# Patient Record
Sex: Female | Born: 1980 | Race: Black or African American | Hispanic: No | Marital: Married | Smoking: Current every day smoker
Health system: Southern US, Community
[De-identification: ages and names within clinical notes are randomized; demographics above are authoritative.]

## PROBLEM LIST (undated history)

## (undated) DIAGNOSIS — G932 Benign intracranial hypertension: Secondary | ICD-10-CM

## (undated) DIAGNOSIS — I493 Ventricular premature depolarization: Secondary | ICD-10-CM

## (undated) DIAGNOSIS — R002 Palpitations: Secondary | ICD-10-CM

## (undated) DIAGNOSIS — M79606 Pain in leg, unspecified: Secondary | ICD-10-CM

## (undated) DIAGNOSIS — E7800 Pure hypercholesterolemia, unspecified: Secondary | ICD-10-CM

## (undated) DIAGNOSIS — C801 Malignant (primary) neoplasm, unspecified: Secondary | ICD-10-CM

## (undated) DIAGNOSIS — R197 Diarrhea, unspecified: Secondary | ICD-10-CM

## (undated) DIAGNOSIS — J45909 Unspecified asthma, uncomplicated: Secondary | ICD-10-CM

## (undated) DIAGNOSIS — F411 Generalized anxiety disorder: Secondary | ICD-10-CM

## (undated) DIAGNOSIS — I4949 Other premature depolarization: Secondary | ICD-10-CM

## (undated) HISTORY — DX: Pure hypercholesterolemia, unspecified: E78.00

## (undated) HISTORY — DX: Malignant (primary) neoplasm, unspecified: C80.1

## (undated) HISTORY — DX: Pain in leg, unspecified: M79.606

## (undated) HISTORY — DX: Diarrhea, unspecified: R19.7

## (undated) HISTORY — DX: Other premature depolarization: I49.49

## (undated) HISTORY — DX: Ventricular premature depolarization: I49.3

## (undated) HISTORY — DX: Palpitations: R00.2

## (undated) HISTORY — DX: Generalized anxiety disorder: F41.1

---

## 2006-03-31 ENCOUNTER — Emergency Department (HOSPITAL_COMMUNITY): Admission: EM | Admit: 2006-03-31 | Discharge: 2006-03-31 | Payer: Self-pay | Admitting: Emergency Medicine

## 2006-09-09 ENCOUNTER — Inpatient Hospital Stay (HOSPITAL_COMMUNITY): Admission: AD | Admit: 2006-09-09 | Discharge: 2006-09-09 | Payer: Self-pay | Admitting: Obstetrics and Gynecology

## 2006-10-19 ENCOUNTER — Inpatient Hospital Stay (HOSPITAL_COMMUNITY): Admission: AD | Admit: 2006-10-19 | Discharge: 2006-10-19 | Payer: Self-pay | Admitting: Obstetrics and Gynecology

## 2006-11-09 ENCOUNTER — Inpatient Hospital Stay (HOSPITAL_COMMUNITY): Admission: AD | Admit: 2006-11-09 | Discharge: 2006-11-09 | Payer: Self-pay | Admitting: Obstetrics and Gynecology

## 2006-11-26 ENCOUNTER — Inpatient Hospital Stay (HOSPITAL_COMMUNITY): Admission: AD | Admit: 2006-11-26 | Discharge: 2006-11-29 | Payer: Self-pay | Admitting: Obstetrics and Gynecology

## 2007-12-20 ENCOUNTER — Emergency Department (HOSPITAL_COMMUNITY): Admission: EM | Admit: 2007-12-20 | Discharge: 2007-12-21 | Payer: Self-pay | Admitting: Emergency Medicine

## 2009-03-11 ENCOUNTER — Inpatient Hospital Stay (HOSPITAL_COMMUNITY): Admission: AD | Admit: 2009-03-11 | Discharge: 2009-03-11 | Payer: Self-pay | Admitting: Obstetrics

## 2009-05-25 ENCOUNTER — Inpatient Hospital Stay (HOSPITAL_COMMUNITY): Admission: AD | Admit: 2009-05-25 | Discharge: 2009-05-25 | Payer: Self-pay | Admitting: Obstetrics and Gynecology

## 2009-05-29 ENCOUNTER — Encounter (INDEPENDENT_AMBULATORY_CARE_PROVIDER_SITE_OTHER): Payer: Self-pay | Admitting: Obstetrics and Gynecology

## 2009-05-29 ENCOUNTER — Inpatient Hospital Stay (HOSPITAL_COMMUNITY): Admission: RE | Admit: 2009-05-29 | Discharge: 2009-05-31 | Payer: Self-pay | Admitting: Obstetrics and Gynecology

## 2009-06-04 ENCOUNTER — Inpatient Hospital Stay (HOSPITAL_COMMUNITY): Admission: AD | Admit: 2009-06-04 | Discharge: 2009-06-05 | Payer: Self-pay | Admitting: Obstetrics

## 2010-08-03 ENCOUNTER — Other Ambulatory Visit: Payer: Self-pay | Admitting: Obstetrics and Gynecology

## 2010-09-22 LAB — CROSSMATCH: ABO/RH(D): A POS

## 2010-09-22 LAB — CBC
HCT: 26.2 % — ABNORMAL LOW (ref 36.0–46.0)
HCT: 26.5 % — ABNORMAL LOW (ref 36.0–46.0)
HCT: 31.3 % — ABNORMAL LOW (ref 36.0–46.0)
Hemoglobin: 11.5 g/dL — ABNORMAL LOW (ref 12.0–15.0)
Hemoglobin: 8.5 g/dL — ABNORMAL LOW (ref 12.0–15.0)
MCHC: 32.2 g/dL (ref 30.0–36.0)
MCHC: 32.3 g/dL (ref 30.0–36.0)
MCHC: 32.9 g/dL (ref 30.0–36.0)
MCV: 83.1 fL (ref 78.0–100.0)
MCV: 84.3 fL (ref 78.0–100.0)
MCV: 84.4 fL (ref 78.0–100.0)
MCV: 84.5 fL (ref 78.0–100.0)
MCV: 84.6 fL (ref 78.0–100.0)
Platelets: 294 10*3/uL (ref 150–400)
Platelets: 324 10*3/uL (ref 150–400)
Platelets: 452 10*3/uL — ABNORMAL HIGH (ref 150–400)
RBC: 2.81 MIL/uL — ABNORMAL LOW (ref 3.87–5.11)
RBC: 3.1 MIL/uL — ABNORMAL LOW (ref 3.87–5.11)
RBC: 4.22 MIL/uL (ref 3.87–5.11)
RDW: 13.3 % (ref 11.5–15.5)
RDW: 13.8 % (ref 11.5–15.5)
WBC: 11.3 10*3/uL — ABNORMAL HIGH (ref 4.0–10.5)
WBC: 12.2 10*3/uL — ABNORMAL HIGH (ref 4.0–10.5)

## 2010-09-22 LAB — LACTATE DEHYDROGENASE: LDH: 252 U/L — ABNORMAL HIGH (ref 94–250)

## 2010-09-22 LAB — COMPREHENSIVE METABOLIC PANEL
Albumin: 2.7 g/dL — ABNORMAL LOW (ref 3.5–5.2)
Albumin: 2.8 g/dL — ABNORMAL LOW (ref 3.5–5.2)
BUN: 13 mg/dL (ref 6–23)
BUN: 6 mg/dL (ref 6–23)
Calcium: 8.8 mg/dL (ref 8.4–10.5)
Creatinine, Ser: 0.67 mg/dL (ref 0.4–1.2)
Creatinine, Ser: 0.88 mg/dL (ref 0.4–1.2)
Total Bilirubin: 0.3 mg/dL (ref 0.3–1.2)
Total Protein: 5.9 g/dL — ABNORMAL LOW (ref 6.0–8.3)
Total Protein: 6.3 g/dL (ref 6.0–8.3)

## 2010-09-22 LAB — ABO/RH: ABO/RH(D): A POS

## 2010-09-22 LAB — URIC ACID: Uric Acid, Serum: 5.4 mg/dL (ref 2.4–7.0)

## 2010-09-25 LAB — URINE CULTURE: Colony Count: 30000

## 2010-09-25 LAB — URINALYSIS, ROUTINE W REFLEX MICROSCOPIC
Ketones, ur: NEGATIVE mg/dL
Nitrite: NEGATIVE
Specific Gravity, Urine: 1.025 (ref 1.005–1.030)
Urobilinogen, UA: 0.2 mg/dL (ref 0.0–1.0)
pH: 6.5 (ref 5.0–8.0)

## 2010-11-03 NOTE — H&P (Signed)
Sonya Page, Sonya Page              ACCOUNT NO.:  [id number]   MEDICAL RECORD NO.:  [id number]          PATIENT TYPE:  INP   LOCATION:  9174                          FACILITY:  [facility]   PHYSICIAN:  Lenoard Aden, M.D.DATE OF BIRTH:  [birth year]   DATE OF ADMISSION:  11/26/2006  DATE OF DISCHARGE:                              HISTORY & PHYSICAL   CHIEF COMPLAINT:  Presumed LGA at 40-weeks for cervical ripening and  induction.   HISTORY OF PRESENT ILLNESS:  She is a 30 year old African-American  female, G2, P0, who presents at 40-weeks gestation for cervical ripening  and induction.  Size/dates discrepancy noted.  Presumed fetal weight  greater than 8 pounds.   ALLERGIES:  She has no known drug allergies.   MEDICATIONS:  1. Prenatal vitamins.  2. Ambien as needed.  3. Reglan as needed.  4. Valtrex.   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  History of tachycardia, stress-induced.  History of  bronchitis.   SOCIAL HISTORY:  She is a nonsmoker, nondrinker.  At this point, she is  a reformed smoker.  She denies domestic or physical violence.  She has a  history of a first trimester induced abortion, second trimester without  complications in 2004.   FAMILY HISTORY:  Diabetes, hypertension, stroke, ovarian and lung  cancer, schizophrenia, bipolar disorder, and alcohol abuse.   PREGNANCY COURSE:  Complicated size/dates discrepancy and multiple  musculoskeletal complaints requiring her to go out of work in the third  trimester due to persistent low back pain.   PHYSICAL EXAMINATION:  GENERAL APPEARANCE:  She is a well-developed,  well-nourished, obese black female in no acute distress.  VITAL SIGNS:  Blood pressure 112/68, weight of 247 pounds.  HEENT:  Normal.  LUNGS:  Clear.  HEART:  Regular rate and rhythm.  ABDOMEN:  Soft, gravid and nontender.  Estimated fetal weight is 8  pounds.  The cervix is 2-cm, 50% vertex, -1.  EXTREMITIES:   There are no cords.  NEUROLOGICAL:  Examination is nonfocal.  SKIN:  Intact.  PELVIC:  NST is reactive.  Cervidil was placed.   IMPRESSION:  1. A 4-week intrauterine pregnancy  2. Presumed large for gestational age.  3. Size/dates discrepancy.  4. History of herpes simplex virus without lesions.   PLAN:  Proceed with induction.  The risks and benefits discussed.  We  will proceed with pitocin in the a.m.      Lenoard Aden, M.D.  Electronically Signed     RJT/MEDQ  D:  11/26/2006  T:  11/26/2006  Job:  [id number]

## 2010-11-03 NOTE — Op Note (Signed)
[name], Sonya Page              ACCOUNT NO.:  [id number]   MEDICAL RECORD NO.:  [id number]          PATIENT TYPE:  INP   LOCATION:  9147                          [facility]ACILITY:  [facility]   PHYSICIAN:  Lenoard Aden, M.D.DATE OF BIRTH:  1980/08/18   DATE OF PROCEDURE:  11/27/2006  DATE OF DISCHARGE:                               OPERATIVE REPORT   INDICATION FOR OPERATIVE DELIVERY:  Maternal exhaustion, fetal  tachycardia, prolonged second stage.   DESCRIPTION OF PROCEDURE:  After being apprised of risks of vacuum  assistance including small incidence of cephalohematoma, scalp  laceration, intracranial hemorrhage, kiwi cup was placed and fetal  vertex OA less than 25 degrees, at +3 to +4 station for one pull. Full-  term living female delivered with assistance over intact perineum,  Apgars 08/09.  Bulb suction done.  Cord blood collected.  Placenta  delivered spontaneously intact, three-vessel cord noted.  Cervix was  inspected and found to be without lacerations.  No vaginal or perineal  lacerations noted.  Estimated blood loss was 500 mL.  The patient  tolerates procedure well and is recovering in good condition.      Lenoard Aden, M.D.  Electronically Signed     RJT/MEDQ  D:  11/27/2006  T:  11/27/2006  Job:  [id number]

## 2011-02-08 ENCOUNTER — Emergency Department (HOSPITAL_COMMUNITY)
Admission: EM | Admit: 2011-02-08 | Discharge: 2011-02-08 | Discharge status: Against Medical Advice | Payer: PRIVATE HEALTH INSURANCE | Attending: Emergency Medicine | Admitting: Emergency Medicine

## 2011-02-08 DIAGNOSIS — M542 Cervicalgia: Secondary | ICD-10-CM | POA: Insufficient documentation

## 2011-02-08 DIAGNOSIS — R5381 Other malaise: Secondary | ICD-10-CM | POA: Insufficient documentation

## 2011-02-08 DIAGNOSIS — R55 Syncope and collapse: Secondary | ICD-10-CM | POA: Insufficient documentation

## 2011-03-20 ENCOUNTER — Emergency Department (HOSPITAL_COMMUNITY)
Admission: EM | Admit: 2011-03-20 | Discharge: 2011-03-20 | Disposition: A | Discharge status: Home / Self Care | Payer: PRIVATE HEALTH INSURANCE | Attending: Emergency Medicine | Admitting: Emergency Medicine

## 2011-03-20 ENCOUNTER — Emergency Department (HOSPITAL_COMMUNITY): Payer: PRIVATE HEALTH INSURANCE

## 2011-03-20 DIAGNOSIS — F17200 Nicotine dependence, unspecified, uncomplicated: Secondary | ICD-10-CM | POA: Insufficient documentation

## 2011-03-20 DIAGNOSIS — R079 Chest pain, unspecified: Secondary | ICD-10-CM | POA: Insufficient documentation

## 2011-03-20 DIAGNOSIS — R000 Tachycardia, unspecified: Secondary | ICD-10-CM | POA: Insufficient documentation

## 2011-03-20 DIAGNOSIS — Z79899 Other long term (current) drug therapy: Secondary | ICD-10-CM | POA: Insufficient documentation

## 2011-03-20 LAB — DIFFERENTIAL
Basophils Absolute: 0 10*3/uL (ref 0.0–0.1)
Eosinophils Relative: 3 % (ref 0–5)
Lymphocytes Relative: 34 % (ref 12–46)
Lymphs Abs: 2.3 10*3/uL (ref 0.7–4.0)
Neutro Abs: 3.8 10*3/uL (ref 1.7–7.7)

## 2011-03-20 LAB — COMPREHENSIVE METABOLIC PANEL
ALT: 24 U/L (ref 0–35)
Alkaline Phosphatase: 81 U/L (ref 39–117)
CO2: 23 mEq/L (ref 19–32)
Chloride: 106 mEq/L (ref 96–112)
GFR calc Af Amer: >60 mL/min (ref >60)
GFR calc non Af Amer: >60 mL/min (ref >60)
Glucose, Bld: 98 mg/dL (ref 70–99)
Potassium: 4.2 mEq/L (ref 3.5–5.1)
Sodium: 140 mEq/L (ref 135–145)
Total Bilirubin: 0.1 mg/dL — ABNORMAL LOW (ref 0.3–1.2)

## 2011-03-20 LAB — CBC
HCT: 40.4 % (ref 36.0–46.0)
Hemoglobin: 13.3 g/dL (ref 12.0–15.0)
MCV: 81.8 fL (ref 78.0–100.0)
RBC: 4.94 MIL/uL (ref 3.87–5.11)
RDW: 13.7 % (ref 11.5–15.5)
WBC: 6.7 10*3/uL (ref 4.0–10.5)

## 2011-03-20 LAB — POCT PREGNANCY, URINE: Preg Test, Ur: NEGATIVE

## 2011-03-20 LAB — URINALYSIS, ROUTINE W REFLEX MICROSCOPIC
Glucose, UA: NEGATIVE mg/dL
Hgb urine dipstick: NEGATIVE
Leukocytes, UA: NEGATIVE
Protein, ur: NEGATIVE mg/dL
pH: 5.5 (ref 5.0–8.0)

## 2011-04-08 LAB — CBC
HCT: 36
MCHC: 32.5
MCHC: 32.9
MCV: 83.1
Platelets: 275
Platelets: 315
RDW: 13.2

## 2011-04-08 LAB — RPR: RPR Ser Ql: NONREACTIVE

## 2011-09-19 ENCOUNTER — Emergency Department (HOSPITAL_COMMUNITY)
Admission: EM | Admit: 2011-09-19 | Discharge: 2011-09-19 | Disposition: A | Discharge status: Home / Self Care | Payer: PRIVATE HEALTH INSURANCE | Attending: Emergency Medicine | Admitting: Emergency Medicine

## 2011-09-19 ENCOUNTER — Emergency Department (HOSPITAL_COMMUNITY): Payer: PRIVATE HEALTH INSURANCE

## 2011-09-19 ENCOUNTER — Encounter (HOSPITAL_COMMUNITY): Payer: Self-pay | Admitting: *Deleted

## 2011-09-19 DIAGNOSIS — G43909 Migraine, unspecified, not intractable, without status migrainosus: Secondary | ICD-10-CM | POA: Insufficient documentation

## 2011-09-19 DIAGNOSIS — H53149 Visual discomfort, unspecified: Secondary | ICD-10-CM | POA: Insufficient documentation

## 2011-09-19 DIAGNOSIS — Z79899 Other long term (current) drug therapy: Secondary | ICD-10-CM | POA: Insufficient documentation

## 2011-09-19 DIAGNOSIS — G932 Benign intracranial hypertension: Secondary | ICD-10-CM | POA: Insufficient documentation

## 2011-09-19 DIAGNOSIS — H5710 Ocular pain, unspecified eye: Secondary | ICD-10-CM | POA: Insufficient documentation

## 2011-09-19 HISTORY — DX: Benign intracranial hypertension: G93.2

## 2011-09-19 MED ORDER — OXYCODONE-ACETAMINOPHEN 5-325 MG PO TABS
1.0000 | ORAL_TABLET | Freq: Once | ORAL | Status: AC
  Administered 2011-09-19: 1 via ORAL
  Filled 2011-09-19: qty 1

## 2011-09-19 MED ORDER — DEXAMETHASONE SODIUM PHOSPHATE 10 MG/ML IJ SOLN
10.0000 mg | Freq: Once | INTRAMUSCULAR | Status: AC
  Administered 2011-09-19: 10 mg via INTRAVENOUS
  Filled 2011-09-19: qty 1

## 2011-09-19 MED ORDER — METOCLOPRAMIDE HCL 5 MG/ML IJ SOLN
10.0000 mg | Freq: Once | INTRAMUSCULAR | Status: AC
  Administered 2011-09-19: 10 mg via INTRAVENOUS
  Filled 2011-09-19: qty 2

## 2011-09-19 MED ORDER — OXYCODONE-ACETAMINOPHEN 5-325 MG PO TABS: 2.0000 | ORAL_TABLET | ORAL | Status: AC | PRN

## 2011-09-19 MED ORDER — DIPHENHYDRAMINE HCL 50 MG/ML IJ SOLN
25.0000 mg | Freq: Once | INTRAMUSCULAR | Status: AC
  Administered 2011-09-19: 25 mg via INTRAVENOUS
  Filled 2011-09-19: qty 1

## 2011-09-19 NOTE — ED Notes (Signed)
Pt had lumbar puncture 2 weeks ago,  [place] Spaniel she was stuck at least 10 times to try obtain fluid and that it came back at over 300.  Pt is very tearful, says her headache is a 20 of 1 to 10,  Took her medication for her persistent headaches but without relief. Perrl,  Pt is alert and oriented,

## 2011-09-19 NOTE — ED Notes (Signed)
Pt c/o headache since last night w/o relief from Fioricet.

## 2011-09-19 NOTE — ED Provider Notes (Signed)
History     CSN: [id number]  Arrival date & time 09/19/11  0408   First MD Initiated Contact with Patient 09/19/11 [phone]      Chief Complaint  Patient presents with  . Headache    pseudotumor cerebri hx    (Consider location/radiation/quality/duration/timing/severity/associated sxs/prior treatment) Patient is a 31 y.o. female presenting with headaches. The history is provided by the patient. No language interpreter was used.  Headache  This is a chronic problem. The current episode started more than 1 week ago. The problem occurs constantly. The problem has been gradually worsening. The headache is associated with bright light, activity and loud noise. The pain is located in the frontal region. The quality of the pain is described as throbbing. The pain is at a severity of 10/10. The pain is severe. Pertinent negatives include no fever, no chest pressure, no nausea and no vomiting. She has tried resting in a darkened room (fioricet) for the symptoms. The treatment provided mild relief.   she is here today complaining of a frontal headache with photophobia and phonophobia since last night. Pain is 10 out of 10. States she had LP in [place] 2 weeks ago and was diagnosed with a two-day tumor cerebral. Wondering today if first cerebral spinal fluid has increased and that's why she's having a headache. She took Diamox,/Fioricet  with mild relief. Pain is 10 out of 10 presently the frontal lobe. Has taken nothing for pain today. No nausea no vomiting. We'll proceed with a CT scan to evaluate her hydrocephalus.  States that she feels pressure behind her eyes that she's having no vision abnormal taste today. Her neurologist is in [place] but she would like to have a neurologist in [place] if we could refer her. LP site unremarkable. Will start with a migraine cocktail. Patient also would like a work note for today. No other past medical history except for a cesarean section.  Past Medical  History  Diagnosis Date  . Pseudotumor cerebri     Past Surgical History  Procedure Date  . Ceserean section     No family history on file.  History  Substance Use Topics  . Smoking status: Not on file  . Smokeless tobacco: Not on file  . Alcohol Use: Yes    OB History    Grav Para Term Preterm Abortions TAB SAB Ect Mult Living                  Review of Systems  Constitutional: Negative.  Negative for fever.  Eyes: Positive for photophobia and pain. Negative for visual disturbance.  Respiratory: Negative.   Cardiovascular: Negative.   Gastrointestinal: Negative.  Negative for nausea and vomiting.  Neurological: Positive for headaches. Negative for dizziness, facial asymmetry, speech difficulty, weakness and numbness.  Psychiatric/Behavioral: Negative.   All other systems reviewed and are negative.    Allergies  Penicillins  Home Medications   Current Outpatient Rx  Name Route Sig Dispense Refill  . ACETAZOLAMIDE 250 MG PO TABS Oral Take 250 mg by mouth daily.    . BUMETANIDE 2 MG PO TABS Oral Take 2 mg by mouth daily.    Marland Kitchen BUTALBITAL-APAP-CAFFEINE 50-325-40 MG PO TABS Oral Take 1 tablet by mouth 2 (two) times daily as needed.    Marland Kitchen MEDROXYPROGESTERONE ACETATE 150 MG/ML IM SUSP Intramuscular Inject 150 mg into the muscle every 3 (three) months.    Marland Kitchen POTASSIUM CHLORIDE CRYS ER 20 MEQ PO TBCR Oral Take 20 mEq by mouth daily.    Marland Kitchen  VALACYCLOVIR HCL 500 MG PO TABS Oral Take 500 mg by mouth daily.      BP 115/72  Pulse 120  Temp(Src) 100 F (37.8 C) (Oral)  Resp 20  SpO2 100%  Physical Exam  Nursing note and vitals reviewed. Constitutional: She is oriented to person, place, and time. She appears well-developed and well-nourished.  HENT:  Head: Normocephalic and atraumatic.  Eyes: Conjunctivae and EOM are normal. Pupils are equal, round, and reactive to light.  Neck: Normal range of motion. Neck supple.  Cardiovascular: Normal rate.   Pulmonary/Chest: Effort  normal.  Abdominal: Soft.  Musculoskeletal: Normal range of motion.  Neurological: She is alert and oriented to person, place, and time. She has normal reflexes. She displays normal reflexes. No cranial nerve deficit. She exhibits normal muscle tone. Coordination normal.       Frontal h/a  Skin: Skin is warm and dry.  Psychiatric: She has a normal mood and affect.    ED Course  Procedures (including critical care time)  Labs Reviewed - No data to display Ct Head Wo Contrast  09/19/2011  *RADIOLOGY REPORT*  Clinical Data: Headache.  CT HEAD WITHOUT CONTRAST  Technique:  Contiguous axial images were obtained from the base of the skull through the vertex without contrast.  Comparison: No priors.  Findings: No acute intracranial abnormalities.  Specifically, no definitive evidence to suggest acute/subacute cerebral ischemia, no focal mass, mass effect, hydrocephalus or abnormal intra or extra- axial fluid collections.  No displaced skull fractures are identified.  Visualized paranasal sinuses and mastoids are well pneumatized, with exception of a tiny soft tissue attenuation lesion in the posterior aspect of the left maxillary sinus which is incompletely visualized, but likely represents a small mucosal retention cyst or polyp.  IMPRESSION: 1.  No acute intracranial abnormalities.  The appearance of the brain is normal. 2.  Probable small mucosal retention cyst or polyp in the left maxillary sinus incidentally noted.  Original Report Authenticated By: Florencia Reasons, M.D.     No diagnosis found.    MDM  C/o frontal h/a since last night no relief with fioracet last pm.   LP 2 weeks ago and diagnosed with pseudo tumor cerebri in [facility].   CT shows no hydrocephalus with normal ventricles.  Will refer to neuro in [place]. Good relief with migraine cocktail in the ER.  4/10.  Will prescribe percocet and refer to [facility].  Patient agrees with plan and is ready for  discharge. Work note provided.         Jethro Bastos, NP 09/19/11 1905

## 2011-09-19 NOTE — ED Notes (Signed)
Patient transported to CT 

## 2011-09-19 NOTE — Discharge Instructions (Signed)
Ms Sonya Page we treated your migraine today with Benadryl, dexamethasone, and Reglan. This seemed to alleviate some of your pain. The CT of your head did not show any extra fluid or hydrocephalus in your brain. We're giving you a followup with [facility]. Call Monday and tell them you're in the ER in your diagnosis. Tell them you need  to be seen this week. Continue  Continue Taking the Fioricet/diamox  for your headaches. We'll add Percocet for severe pain. Do not drive with Percocet or Fioricet. Return to the ER for severe pain high fever or any other concerns.  Migraine Headache A migraine headache is an intense, throbbing pain on one or both sides of your head. The exact cause of a migraine headache is not always known. A migraine may be caused when nerves in the brain become irritated and release chemicals that cause swelling within blood vessels, causing pain. Many migraine sufferers have a family history of migraines. Before you get a migraine you may or may not get an aura. An aura is a group of symptoms that can predict the beginning of a migraine. An aura may include:  Visual changes such as:   Flashing lights.   Bright spots or zig-zag lines.   Tunnel vision.   Feelings of numbness.   Trouble talking.   Muscle weakness.  SYMPTOMS  Pain on one or both sides of your head.   Pain that is pulsating or throbbing in nature.   Pain that is severe enough to prevent daily activities.   Pain that is aggravated by any daily physical activity.   Nausea (feeling sick to your stomach), vomiting, or both.   Pain with exposure to bright lights, loud noises, or activity.   General sensitivity to bright lights or loud noises.  MIGRAINE TRIGGERS Examples of triggers of migraine headaches include:   Alcohol.   Smoking.   Stress.   It may be related to menses (female menstruation).   Aged cheeses.   Foods or drinks that contain nitrates, glutamate, aspartame,  or tyramine.   Lack of sleep.   Chocolate.   Caffeine.   Hunger.   Medications such as nitroglycerine (used to treat chest pain), birth control pills, estrogen, and some blood pressure medications.  DIAGNOSIS  A migraine headache is often diagnosed based on:  Symptoms.   Physical examination.   A computerized X-ray scan (computed tomography, CT) of your head.  TREATMENT  Medications can help prevent migraines if they are recurrent or should they become recurrent. Your caregiver can help you with a medication or treatment program that will be helpful to you.   Lying down in a dark, quiet room may be helpful.   Keeping a headache diary may help you find a trend as to what may be triggering your headaches.  SEEK IMMEDIATE MEDICAL CARE IF:   You have confusion, personality changes or seizures.   You have headaches that wake you from sleep.   You have an increased frequency in your headaches.   You have a stiff neck.   You have a loss of vision.   You have muscle weakness.   You start losing your balance or have trouble walking.   You feel faint or pass out.  MAKE SURE YOU:   Understand these instructions.   Will watch your condition.   Will get help right away if you are not doing well or get worse.  Document Released: 06/07/2005 Document Revised: 05/27/2011 Document Reviewed: 01/21/2009 [employer] Patient  Information 2012 [place], [place].Migraine Headache A migraine headache is an intense, throbbing pain on one or both sides of your head. The exact cause of a migraine headache is not always known. A migraine may be caused when nerves in the brain become irritated and release chemicals that cause swelling within blood vessels, causing pain. Many migraine sufferers have a family history of migraines. Before you get a migraine you may or may not get an aura. An aura is a group of symptoms that can predict the beginning of a migraine. An aura may include:  Visual changes  such as:   Flashing lights.   Bright spots or zig-zag lines.   Tunnel vision.   Feelings of numbness.   Trouble talking.   Muscle weakness.  SYMPTOMS  Pain on one or both sides of your head.   Pain that is pulsating or throbbing in nature.   Pain that is severe enough to prevent daily activities.   Pain that is aggravated by any daily physical activity.   Nausea (feeling sick to your stomach), vomiting, or both.   Pain with exposure to bright lights, loud noises, or activity.   General sensitivity to bright lights or loud noises.  MIGRAINE TRIGGERS Examples of triggers of migraine headaches include:   Alcohol.   Smoking.   Stress.   It may be related to menses (female menstruation).   Aged cheeses.   Foods or drinks that contain nitrates, glutamate, aspartame, or tyramine.   Lack of sleep.   Chocolate.   Caffeine.   Hunger.   Medications such as nitroglycerine (used to treat chest pain), birth control pills, estrogen, and some blood pressure medications.  DIAGNOSIS  A migraine headache is often diagnosed based on:  Symptoms.   Physical examination.   A computerized X-ray scan (computed tomography, CT) of your head.  TREATMENT  Medications can help prevent migraines if they are recurrent or should they become recurrent. Your caregiver can help you with a medication or treatment program that will be helpful to you.   Lying down in a dark, quiet room may be helpful.   Keeping a headache diary may help you find a trend as to what may be triggering your headaches.  SEEK IMMEDIATE MEDICAL CARE IF:   You have confusion, personality changes or seizures.   You have headaches that wake you from sleep.   You have an increased frequency in your headaches.   You have a stiff neck.   You have a loss of vision.   You have muscle weakness.   You start losing your balance or have trouble walking.   You feel faint or pass out.  MAKE SURE YOU:    Understand these instructions.   Will watch your condition.   Will get help right away if you are not doing well or get worse.  Document Released: 06/07/2005 Document Revised: 05/27/2011 Document Reviewed: 01/21/2009 [facility] Patient Information 2012 [place], [place].Migraine Headache A migraine headache is an intense, throbbing pain on one or both sides of your head. The exact cause of a migraine headache is not always known. A migraine may be caused when nerves in the brain become irritated and release chemicals that cause swelling within blood vessels, causing pain. Many migraine sufferers have a family history of migraines. Before you get a migraine you may or may not get an aura. An aura is a group of symptoms that can predict the beginning of a migraine. An aura may include:  Visual changes such  as:   Flashing lights.   Bright spots or zig-zag lines.   Tunnel vision.   Feelings of numbness.   Trouble talking.   Muscle weakness.  SYMPTOMS  Pain on one or both sides of your head.   Pain that is pulsating or throbbing in nature.   Pain that is severe enough to prevent daily activities.   Pain that is aggravated by any daily physical activity.   Nausea (feeling sick to your stomach), vomiting, or both.   Pain with exposure to bright lights, loud noises, or activity.   General sensitivity to bright lights or loud noises.  MIGRAINE TRIGGERS Examples of triggers of migraine headaches include:   Alcohol.   Smoking.   Stress.   It may be related to menses (female menstruation).   Aged cheeses.   Foods or drinks that contain nitrates, glutamate, aspartame, or tyramine.   Lack of sleep.   Chocolate.   Caffeine.   Hunger.   Medications such as nitroglycerine (used to treat chest pain), birth control pills, estrogen, and some blood pressure medications.  DIAGNOSIS  A migraine headache is often diagnosed based on:  Symptoms.   Physical examination.   A  computerized X-ray scan (computed tomography, CT) of your head.  TREATMENT  Medications can help prevent migraines if they are recurrent or should they become recurrent. Your caregiver can help you with a medication or treatment program that will be helpful to you.   Lying down in a dark, quiet room may be helpful.   Keeping a headache diary may help you find a trend as to what may be triggering your headaches.  SEEK IMMEDIATE MEDICAL CARE IF:   You have confusion, personality changes or seizures.   You have headaches that wake you from sleep.   You have an increased frequency in your headaches.   You have a stiff neck.   You have a loss of vision.   You have muscle weakness.   You start losing your balance or have trouble walking.   You feel faint or pass out.  MAKE SURE YOU:   Understand these instructions.   Will watch your condition.   Will get help right away if you are not doing well or get worse.  Document Released: 06/07/2005 Document Revised: 05/27/2011 Document Reviewed: 01/21/2009 [facility] Patient Information 2012 [place], [place].  Migraine Headache A migraine headache is an intense, throbbing pain on one or both sides of your head. The exact cause of a migraine headache is not always known. A migraine may be caused when nerves in the brain become irritated and release chemicals that cause swelling within blood vessels, causing pain. Many migraine sufferers have a family history of migraines. Before you get a migraine you may or may not get an aura. An aura is a group of symptoms that can predict the beginning of a migraine. An aura may include:  Visual changes such as:   Flashing lights.   Bright spots or zig-zag lines.   Tunnel vision.   Feelings of numbness.   Trouble talking.   Muscle weakness.  SYMPTOMS  Pain on one or both sides of your head.   Pain that is pulsating or throbbing in nature.   Pain that is severe enough to prevent daily  activities.   Pain that is aggravated by any daily physical activity.   Nausea (feeling sick to your stomach), vomiting, or both.   Pain with exposure to bright lights, loud noises, or activity.  General sensitivity to bright lights or loud noises.  MIGRAINE TRIGGERS Examples of triggers of migraine headaches include:   Alcohol.   Smoking.   Stress.   It may be related to menses (female menstruation).   Aged cheeses.   Foods or drinks that contain nitrates, glutamate, aspartame, or tyramine.   Lack of sleep.   Chocolate.   Caffeine.   Hunger.   Medications such as nitroglycerine (used to treat chest pain), birth control pills, estrogen, and some blood pressure medications.  DIAGNOSIS  A migraine headache is often diagnosed based on:  Symptoms.   Physical examination.   A computerized X-ray scan (computed tomography, CT) of your head.  TREATMENT  Medications can help prevent migraines if they are recurrent or should they become recurrent. Your caregiver can help you with a medication or treatment program that will be helpful to you.   Lying down in a dark, quiet room may be helpful.   Keeping a headache diary may help you find a trend as to what may be triggering your headaches.  SEEK IMMEDIATE MEDICAL CARE IF:   You have confusion, personality changes or seizures.   You have headaches that wake you from sleep.   You have an increased frequency in your headaches.   You have a stiff neck.   You have a loss of vision.   You have muscle weakness.   You start losing your balance or have trouble walking.   You feel faint or pass out.  MAKE SURE YOU:   Understand these instructions.   Will watch your condition.   Will get help right away if you are not doing well or get worse.  Document Released: 06/07/2005 Document Revised: 05/27/2011 Document Reviewed: 01/21/2009 [facility] Patient Information 2012 [place], [place].

## 2011-09-19 NOTE — ED Notes (Signed)
EDP at bedside  

## 2011-09-20 NOTE — ED Provider Notes (Signed)
Medical screening examination/treatment/procedure(s) were performed by non-physician practitioner and as supervising physician I was immediately available for consultation/collaboration.   Virgil Slinger, MD 09/20/11 0248 

## 2012-05-31 ENCOUNTER — Encounter (HOSPITAL_COMMUNITY): Payer: Self-pay | Admitting: Emergency Medicine

## 2012-05-31 ENCOUNTER — Emergency Department (HOSPITAL_COMMUNITY)
Admission: EM | Admit: 2012-05-31 | Discharge: 2012-06-01 | Disposition: A | Discharge status: Home / Self Care | Payer: PRIVATE HEALTH INSURANCE | Attending: Emergency Medicine | Admitting: Emergency Medicine

## 2012-05-31 DIAGNOSIS — R221 Localized swelling, mass and lump, neck: Secondary | ICD-10-CM

## 2012-05-31 DIAGNOSIS — R220 Localized swelling, mass and lump, head: Secondary | ICD-10-CM | POA: Insufficient documentation

## 2012-05-31 DIAGNOSIS — Z79899 Other long term (current) drug therapy: Secondary | ICD-10-CM | POA: Insufficient documentation

## 2012-05-31 DIAGNOSIS — F17200 Nicotine dependence, unspecified, uncomplicated: Secondary | ICD-10-CM | POA: Insufficient documentation

## 2012-05-31 DIAGNOSIS — R1310 Dysphagia, unspecified: Secondary | ICD-10-CM | POA: Insufficient documentation

## 2012-05-31 DIAGNOSIS — Z8669 Personal history of other diseases of the nervous system and sense organs: Secondary | ICD-10-CM | POA: Insufficient documentation

## 2012-05-31 MED ORDER — PREDNISONE 20 MG PO TABS
60.0000 mg | ORAL_TABLET | Freq: Once | ORAL | Status: AC
  Administered 2012-06-01: 60 mg via ORAL
  Filled 2012-05-31: qty 3

## 2012-05-31 MED ORDER — FAMOTIDINE 20 MG PO TABS
40.0000 mg | ORAL_TABLET | Freq: Once | ORAL | Status: AC
  Administered 2012-06-01: 40 mg via ORAL
  Filled 2012-05-31: qty 1

## 2012-05-31 NOTE — ED Provider Notes (Signed)
History     CSN: [id number]  Arrival date & time 05/31/12  2305   First MD Initiated Contact with Patient 05/31/12 2341      Chief Complaint  Patient presents with  . Sore Throat    (Consider location/radiation/quality/duration/timing/severity/associated sxs/prior treatment) HPI  31 year old female presents complaining of throat swelling. Patient reports she was rocking her child (who is sick)  to sleep tonight when she noticed that her throat was swelling.  Incident happened 2 hrs ago, onset gradual, persistent, swelling is worsening.  She tries to drink fluid and was able to keep it down but swelling did not decreased.   She did had a mild headache initially and took an aleve 1 hr before.  Headache has improved. She denies any fever, or sore throat, sneezing, cough, cp, sob, n/v/d, abd pain or rash.  Her child was sick with a sinus infection.  Pt has taken aleve in the past without any side effect.  Has prior hx of PCN, unsure of reaction.  Sts her husband has placed some tylenol that was in the same medicine box as his penicillin, which he gave her.  She did not take the tylenol, but she doesn't know if there may be cross contamination that may caused her sxs.    Past Medical History  Diagnosis Date  . Pseudotumor cerebri     Past Surgical History  Procedure Date  . Ceserean section     No family history on file.  History  Substance Use Topics  . Smoking status: Current Every Day Smoker -- 1.0 packs/day  . Smokeless tobacco: Not on file  . Alcohol Use: Yes     Comment: holidays    OB History    Grav Para Term Preterm Abortions TAB SAB Ect Mult Living                  Review of Systems  Constitutional: Negative for fever.  HENT: Positive for trouble swallowing. Negative for ear pain, sore throat, sneezing, drooling, neck pain and voice change.   Respiratory: Negative for chest tightness and shortness of breath.   Cardiovascular: Negative for chest pain.   Gastrointestinal: Negative for abdominal pain.  Skin: Negative for rash.  Neurological: Negative for light-headedness and headaches.    Allergies  Penicillins  Home Medications   Current Outpatient Rx  Name  Route  Sig  Dispense  Refill  . ACETAZOLAMIDE 250 MG PO TABS   Oral   Take 250 mg by mouth daily.         . BUMETANIDE 2 MG PO TABS   Oral   Take 2 mg by mouth daily.         Marland Kitchen BUTALBITAL-APAP-CAFFEINE 50-325-40 MG PO TABS   Oral   Take 1 tablet by mouth 2 (two) times daily as needed.         Marland Kitchen MEDROXYPROGESTERONE ACETATE 150 MG/ML IM SUSP   Intramuscular   Inject 150 mg into the muscle every 3 (three) months.         Marland Kitchen POTASSIUM CHLORIDE CRYS ER 20 MEQ PO TBCR   Oral   Take 20 mEq by mouth daily.         Marland Kitchen VALACYCLOVIR HCL 500 MG PO TABS   Oral   Take 500 mg by mouth daily.           BP 119/84  Pulse 99  Temp 98.4 F (36.9 C) (Oral)  Resp 18  Ht 5\' 3"  (1.6 m)  Wt 250 lb (113.399 kg)  BMI 44.29 kg/m2  SpO2 98%  Physical Exam  Nursing note and vitals reviewed. Constitutional: She is oriented to person, place, and time. She appears well-developed. No distress.  HENT:  Head: Normocephalic and atraumatic.  Right Ear: External ear normal.  Left Ear: External ear normal.  Mouth/Throat: Oropharynx is clear and moist.       Uvula is midline but moderately edematous.  No other swelling noted, no tonsilar enlargement or exudates.  No evidence of deep tissue infection.    No trismus, no drooling, no voice changes.    Eyes: Conjunctivae normal are normal.  Neck: Normal range of motion. Neck supple.  Cardiovascular: Normal rate and regular rhythm.   Pulmonary/Chest: Effort normal and breath sounds normal. No stridor. No respiratory distress. She has no wheezes.  Abdominal: Soft. There is no tenderness.  Lymphadenopathy:    She has no cervical adenopathy.  Neurological: She is alert and oriented to person, place, and time.  Skin: Skin is warm. No  rash noted.  Psychiatric: She has a normal mood and affect.    ED Course  Procedures (including critical care time)  Results for orders placed during the hospital encounter of 05/31/12  RAPID STREP SCREEN      Component Value Range   Streptococcus, Group A Screen (Direct) NEGATIVE  NEGATIVE   No results found.   1. Uvula swelling  MDM  Pt presents with Uvula edema x 2-3 hrs.  Otherwise no airway compromise. No hypotension. Strep test neg. Will continue to monitor.  This could be reaction from NSAIDs, but pt did have sick contact with her sick child.    2:53 AM Patient has been monitored for more than 3 hours. There is no worsening of her symptoms. She is able to tolerates by mouth and able to swallow without difficulty. There are no voice changes. Patient stable to be discharged. Strict return precaution given. Patient voiced understanding and agrees with plan.   BP 119/84  Pulse 99  Temp 98.4 F (36.9 C) (Oral)  Resp 18  Ht 5\' 3"  (1.6 m)  Wt 250 lb (113.399 kg)  BMI 44.29 kg/m2  SpO2 98%     Fayrene Helper, PA-C 06/01/12 0254  Fayrene Helper, PA-C 06/01/12 1478

## 2012-05-31 NOTE — ED Notes (Signed)
Pt states she awoke tonight with pain when swallowing, Uvula appears swollen. Pt states child is sick with sinus infection, pt denies fever, denies n/v/d. Minor nonproductive cough per pt. PWD

## 2012-06-01 MED ORDER — FAMOTIDINE 40 MG PO TABS: 40.0000 mg | ORAL_TABLET | Freq: Every day | ORAL | Status: DC

## 2012-06-01 MED ORDER — FAMOTIDINE 20 MG PO TABS
ORAL_TABLET | ORAL | Status: AC
  Filled 2012-06-01: qty 1

## 2012-06-01 MED ORDER — PREDNISONE 10 MG PO TABS: 20.0000 mg | ORAL_TABLET | Freq: Every day | ORAL | Status: DC

## 2012-06-01 NOTE — ED Provider Notes (Signed)
  Medical screening examination/treatment/procedure(s) were performed by non-physician practitioner and as supervising physician I was immediately available for consultation/collaboration.    Myla Mauriello D Zunaira Lamy, MD 06/01/12 0812 

## 2013-11-06 ENCOUNTER — Encounter (HOSPITAL_COMMUNITY): Payer: Self-pay | Admitting: Emergency Medicine

## 2013-11-06 ENCOUNTER — Emergency Department (HOSPITAL_COMMUNITY)
Admission: EM | Admit: 2013-11-06 | Discharge: 2013-11-06 | Disposition: A | Discharge status: Home / Self Care | Payer: BC Managed Care – PPO | Source: Home / Self Care | Attending: Family Medicine | Admitting: Family Medicine

## 2013-11-06 DIAGNOSIS — H00014 Hordeolum externum left upper eyelid: Secondary | ICD-10-CM

## 2013-11-06 DIAGNOSIS — H00019 Hordeolum externum unspecified eye, unspecified eyelid: Secondary | ICD-10-CM

## 2013-11-06 MED ORDER — ERYTHROMYCIN 5 MG/GM OP OINT: TOPICAL_OINTMENT | OPHTHALMIC | Status: DC

## 2013-11-06 NOTE — Discharge Instructions (Signed)
Sty A sty (hordeolum) is an infection of a gland in the eyelid located at the base of the eyelash. A sty may develop a white or yellow head of pus. It can be puffy (swollen). Usually, the sty will burst and pus will come out on its own. They do not leave lumps in the eyelid once they drain. A sty is often confused with another form of cyst of the eyelid called a chalazion. Chalazions occur within the eyelid and not on the edge where the bases of the eyelashes are. They often are red, sore and then form firm lumps in the eyelid. CAUSES   Germs (bacteria).  Lasting (chronic) eyelid inflammation. SYMPTOMS   Tenderness, redness and swelling along the edge of the eyelid at the base of the eyelashes.  Sometimes, there is a white or yellow head of pus. It may or may not drain. DIAGNOSIS  An ophthalmologist will be able to distinguish between a sty and a chalazion and treat the condition appropriately.  TREATMENT   Styes are typically treated with warm packs (compresses) until drainage occurs.  In rare cases, medicines that kill germs (antibiotics) may be prescribed. These antibiotics may be in the form of drops, cream or pills.  If a hard lump has formed, it is generally necessary to do a small incision and remove the hardened contents of the cyst in a minor surgical procedure done in the office.  In suspicious cases, your caregiver may send the contents of the cyst to the lab to be certain that it is not a rare, but dangerous form of cancer of the glands of the eyelid. HOME CARE INSTRUCTIONS   Wash your hands often and dry them with a clean towel. Avoid touching your eyelid. This may spread the infection to other parts of the eye.  Apply heat to your eyelid for 10 to 20 minutes, several times a day, to ease pain and help to heal it faster.  Do not squeeze the sty. Allow it to drain on its own. Wash your eyelid carefully 3 to 4 times per day to remove any pus. SEEK IMMEDIATE MEDICAL CARE IF:    Your eye becomes painful or puffy (swollen).  Your vision changes.  Your sty does not drain by itself within 3 days.  Your sty comes back within a short period of time, even with treatment.  You have redness (inflammation) around the eye.  You have a fever. Document Released: 03/17/2005 Document Revised: 08/30/2011 Document Reviewed: 11/19/2008 Exit[employer] Patient Information 2014 [employer].  

## 2013-11-06 NOTE — ED Notes (Addendum)
Patient c/o left eye swelling onset yesterday. Patient reports eye is painful and has a mucous discharge. Took 12.5 mg of Benadryl with no relief. Patient is alert and oriented and in no acute distress.

## 2013-11-06 NOTE — ED Provider Notes (Signed)
Medical screening examination/treatment/procedure(s) were performed by resident physician or non-physician practitioner and as supervising physician I was immediately available for consultation/collaboration.   Pauline Good MD.   Billy Fischer, MD 11/06/13 [phone]

## 2013-11-06 NOTE — ED Provider Notes (Signed)
CSN: [id number]     Arrival date & time 11/06/13  0848 History   None    Chief Complaint  Patient presents with  . Facial Swelling   (Consider location/radiation/quality/duration/timing/severity/associated sxs/prior Treatment) HPI Comments: 2-3 days of mildly tender left upper eyelid swelling. No known injury. No contact lenses. No fever. No vision changes.   The history is provided by the patient.    Past Medical History  Diagnosis Date  . Pseudotumor cerebri    Past Surgical History  Procedure Laterality Date  . Ceserean section     No family history on file. History  Substance Use Topics  . Smoking status: Current Every Day Smoker -- 1.00 packs/day  . Smokeless tobacco: Not on file  . Alcohol Use: Yes     Comment: holidays   OB History   Grav Para Term Preterm Abortions TAB SAB Ect Mult Living                 Review of Systems  All other systems reviewed and are negative.   Allergies  Penicillins  Home Medications   Prior to Admission medications   Medication Sig Start Date End Date Taking? Authorizing Provider  famotidine (PEPCID) 40 MG tablet Take 1 tablet (40 mg total) by mouth daily. 06/01/12   Domenic Moras, PA-C  medroxyPROGESTERone (DEPO-PROVERA) 150 MG/ML injection Inject 150 mg into the muscle every 3 (three) months.    Historical Provider, MD  predniSONE (DELTASONE) 10 MG tablet Take 2 tablets (20 mg total) by mouth daily. 06/01/12   Domenic Moras, PA-C  valACYclovir (VALTREX) 500 MG tablet Take 500 mg by mouth daily.    Historical Provider, MD   BP 126/87  Pulse 95  Temp(Src) 99 F (37.2 C) (Oral)  Resp 18  SpO2 100% Physical Exam  Nursing note and vitals reviewed. Constitutional: She is oriented to person, place, and time. She appears well-developed and well-nourished.  HENT:  Head: Normocephalic and atraumatic.  Eyes: Conjunctivae are normal. Right eye exhibits no discharge. Left eye exhibits no discharge. No scleral icterus.  Small hordeolum  left upper lid  Cardiovascular: Normal rate, regular rhythm and normal heart sounds.   Pulmonary/Chest: Effort normal and breath sounds normal.  Musculoskeletal: Normal range of motion.  Neurological: She is alert and oriented to person, place, and time.  Skin: Skin is warm and dry.  Psychiatric: She has a normal mood and affect. Her behavior is normal.    ED Course  Procedures (including critical care time) Labs Review Labs Reviewed - No data to display  Imaging Review No results found.   MDM   1. Hordeolum externum of left upper eyelid    E-mycin opth ointment BID and warm compresses QID until healed. PCP follow up if no improvement in one week.     [place], [place] 11/06/13 [phone]

## 2013-11-19 DIAGNOSIS — R002 Palpitations: Secondary | ICD-10-CM

## 2013-11-19 HISTORY — DX: Palpitations: R00.2

## 2014-01-16 ENCOUNTER — Institutional Professional Consult (permissible substitution): Payer: BC Managed Care – PPO | Admitting: Internal Medicine

## 2014-01-16 ENCOUNTER — Encounter: Payer: Self-pay | Admitting: Internal Medicine

## 2014-01-18 ENCOUNTER — Encounter: Payer: Self-pay | Admitting: Internal Medicine

## 2014-02-20 ENCOUNTER — Institutional Professional Consult (permissible substitution): Payer: BC Managed Care – PPO | Admitting: Internal Medicine

## 2015-08-16 ENCOUNTER — Emergency Department (HOSPITAL_COMMUNITY): Payer: BC Managed Care – PPO

## 2015-08-16 ENCOUNTER — Encounter (HOSPITAL_COMMUNITY): Payer: Self-pay | Admitting: Emergency Medicine

## 2015-08-16 ENCOUNTER — Emergency Department (HOSPITAL_COMMUNITY)
Admission: EM | Admit: 2015-08-16 | Discharge: 2015-08-16 | Disposition: A | Discharge status: Home / Self Care | Payer: BC Managed Care – PPO | Attending: Emergency Medicine | Admitting: Emergency Medicine

## 2015-08-16 DIAGNOSIS — F17210 Nicotine dependence, cigarettes, uncomplicated: Secondary | ICD-10-CM | POA: Diagnosis not present

## 2015-08-16 DIAGNOSIS — F419 Anxiety disorder, unspecified: Secondary | ICD-10-CM | POA: Diagnosis not present

## 2015-08-16 DIAGNOSIS — Z8669 Personal history of other diseases of the nervous system and sense organs: Secondary | ICD-10-CM | POA: Insufficient documentation

## 2015-08-16 DIAGNOSIS — Z8639 Personal history of other endocrine, nutritional and metabolic disease: Secondary | ICD-10-CM | POA: Insufficient documentation

## 2015-08-16 DIAGNOSIS — Z859 Personal history of malignant neoplasm, unspecified: Secondary | ICD-10-CM | POA: Diagnosis not present

## 2015-08-16 DIAGNOSIS — J069 Acute upper respiratory infection, unspecified: Secondary | ICD-10-CM | POA: Diagnosis not present

## 2015-08-16 DIAGNOSIS — R05 Cough: Secondary | ICD-10-CM

## 2015-08-16 DIAGNOSIS — Z8679 Personal history of other diseases of the circulatory system: Secondary | ICD-10-CM | POA: Insufficient documentation

## 2015-08-16 DIAGNOSIS — Z79899 Other long term (current) drug therapy: Secondary | ICD-10-CM | POA: Diagnosis not present

## 2015-08-16 DIAGNOSIS — Z880 Allergy status to penicillin: Secondary | ICD-10-CM | POA: Diagnosis not present

## 2015-08-16 DIAGNOSIS — J45909 Unspecified asthma, uncomplicated: Secondary | ICD-10-CM | POA: Insufficient documentation

## 2015-08-16 DIAGNOSIS — R059 Cough, unspecified: Secondary | ICD-10-CM

## 2015-08-16 HISTORY — DX: Unspecified asthma, uncomplicated: J45.909

## 2015-08-16 MED ORDER — AZITHROMYCIN 250 MG PO TABS: 250.0000 mg | ORAL_TABLET | Freq: Every day | ORAL | Status: DC

## 2015-08-16 NOTE — ED Notes (Signed)
Pt reports she was at home with son who had strep throat and flu. Pt began to have sore throat earlier in the week. Went to PCP, was strep negative. Pt reports that now she is beginning to have a cough. Hx of bronchitis. Came here for treatment before symptoms became worse.

## 2015-08-16 NOTE — ED Notes (Signed)
Pt stated to RN that she was upset regarding conversation with Pa in which she was told that we do not give Rx for antibiotics for issues beginning 2 days ago.  Rn spoke with PA and PA is going to get her supervising MD to review plan of care for Pt.

## 2015-08-16 NOTE — Discharge Instructions (Signed)
Cough, Adult °Coughing is a reflex that clears your throat and your airways. Coughing helps to heal and protect your lungs. It is normal to cough occasionally, but a cough that happens with other symptoms or lasts a long time may be a sign of a condition that needs treatment. A cough may last only 2-3 weeks (acute), or it may last longer than 8 weeks (chronic). °CAUSES °Coughing is commonly caused by: °· Breathing in substances that irritate your lungs. °· A viral or bacterial respiratory infection. °· Allergies. °· Asthma. °· Postnasal drip. °· Smoking. °· Acid backing up from the stomach into the esophagus (gastroesophageal reflux). °· Certain medicines. °· Chronic lung problems, including COPD (or rarely, lung cancer). °· Other medical conditions such as heart failure. °HOME CARE INSTRUCTIONS  °Pay attention to any changes in your symptoms. Take these actions to help with your discomfort: °· Take medicines only as told by your health care provider. °· If you were prescribed an antibiotic medicine, take it as told by your health care provider. Do not stop taking the antibiotic even if you start to feel better. °· Talk with your health care provider before you take a cough suppressant medicine. °· Drink enough fluid to keep your urine clear or pale yellow. °· If the air is dry, use a cold steam vaporizer or humidifier in your bedroom or your home to help loosen secretions. °· Avoid anything that causes you to cough at work or at home. °· If your cough is worse at night, try sleeping in a semi-upright position. °· Avoid cigarette smoke. If you smoke, quit smoking. If you need help quitting, ask your health care provider. °· Avoid caffeine. °· Avoid alcohol. °· Rest as needed. °SEEK MEDICAL CARE IF:  °· You have new symptoms. °· You cough up pus. °· Your cough does not get better after 2-3 weeks, or your cough gets worse. °· You cannot control your cough with suppressant medicines and you are losing sleep. °· You  develop pain that is getting worse or pain that is not controlled with pain medicines. °· You have a fever. °· You have unexplained weight loss. °· You have night sweats. °SEEK IMMEDIATE MEDICAL CARE IF: °· You cough up blood. °· You have difficulty breathing. °· Your heartbeat is very fast. °  °This information is not intended to replace advice given to you by your health care provider. Make sure you discuss any questions you have with your health care provider. °  °Document Released: 12/04/2010 Document Revised: 02/26/2015 Document Reviewed: 08/14/2014 °Elsevier Interactive Patient Education ©2016 [employer] ° °Upper Respiratory Infection, Adult °Most upper respiratory infections (URIs) are a viral infection of the air passages leading to the lungs. A URI affects the nose, throat, and upper air passages. The most common type of URI is nasopharyngitis and is typically referred to as "the common cold." °URIs run their course and usually go away on their own. Most of the time, a URI does not require medical attention, but sometimes a bacterial infection in the upper airways can follow a viral infection. This is called a secondary infection. Sinus and middle ear infections are common types of secondary upper respiratory infections. °Bacterial pneumonia can also complicate a URI. A URI can worsen asthma and chronic obstructive pulmonary disease (COPD). Sometimes, these complications can require emergency medical care and may be life threatening.  °CAUSES °Almost all URIs are caused by viruses. A virus is a type of germ and can spread from one   person to another.  °RISKS FACTORS °You may be at risk for a URI if:  °· You smoke.   °· You have chronic heart or lung disease. °· You have a weakened defense (immune) system.   °· You are very young or very old.   °· You have nasal allergies or asthma. °· You work in crowded or poorly ventilated areas. °· You work in health care facilities or schools. °SIGNS AND SYMPTOMS    °Symptoms typically develop 2-3 days after you come in contact with a cold virus. Most viral URIs last 7-10 days. However, viral URIs from the influenza virus (flu virus) can last 14-18 days and are typically more severe. Symptoms may include:  °· Runny or stuffy (congested) nose.   °· Sneezing.   °· Cough.   °· Sore throat.   °· Headache.   °· Fatigue.   °· Fever.   °· Loss of appetite.   °· Pain in your forehead, behind your eyes, and over your cheekbones (sinus pain). °· Muscle aches.   °DIAGNOSIS  °Your health care provider may diagnose a URI by: °· Physical exam. °· Tests to check that your symptoms are not due to another condition such as: °¨ Strep throat. °¨ Sinusitis. °¨ Pneumonia. °¨ Asthma. °TREATMENT  °A URI goes away on its own with time. It cannot be cured with medicines, but medicines may be prescribed or recommended to relieve symptoms. Medicines may help: °· Reduce your fever. °· Reduce your cough. °· Relieve nasal congestion. °HOME CARE INSTRUCTIONS  °· Take medicines only as directed by your health care provider.   °· Gargle warm saltwater or take cough drops to comfort your throat as directed by your health care provider. °· Use a warm mist humidifier or inhale steam from a shower to increase air moisture. This may make it easier to breathe. °· Drink enough fluid to keep your urine clear or pale yellow.   °· Eat soups and other clear broths and maintain good nutrition.   °· Rest as needed.   °· Return to work when your temperature has returned to normal or as your health care provider advises. You may need to stay home longer to avoid infecting others. You can also use a face mask and careful hand washing to prevent spread of the virus. °· Increase the usage of your inhaler if you have asthma.   °· Do not use any tobacco products, including cigarettes, chewing tobacco, or electronic cigarettes. If you need help quitting, ask your health care provider. °PREVENTION  °The best way to protect  yourself from getting a cold is to practice good hygiene.  °· Avoid oral or hand contact with people with cold symptoms.   °· Wash your hands often if contact occurs.   °There is no clear evidence that vitamin C, vitamin E, echinacea, or exercise reduces the chance of developing a cold. However, it is always recommended to get plenty of rest, exercise, and practice good nutrition.  °SEEK MEDICAL CARE IF:  °· You are getting worse rather than better.   °· Your symptoms are not controlled by medicine.   °· You have chills. °· You have worsening shortness of breath. °· You have brown or red mucus. °· You have yellow or brown nasal discharge. °· You have pain in your face, especially when you bend forward. °· You have a fever. °· You have swollen neck glands. °· You have pain while swallowing. °· You have white areas in the back of your throat. °SEEK IMMEDIATE MEDICAL CARE IF:  °· You have severe or persistent: °¨ Headache. °¨ Ear pain. °¨   Sinus pain. °¨ Chest pain. °· You have chronic lung disease and any of the following: °¨ Wheezing. °¨ Prolonged cough. °¨ Coughing up blood. °¨ A change in your usual mucus. °· You have a stiff neck. °· You have changes in your: °¨ Vision. °¨ Hearing. °¨ Thinking. °¨ Mood. °MAKE SURE YOU:  °· Understand these instructions. °· Will watch your condition. °· Will get help right away if you are not doing well or get worse. °  °This information is not intended to replace advice given to you by your health care provider. Make sure you discuss any questions you have with your health care provider. °  °Document Released: 12/01/2000 Document Revised: 10/22/2014 Document Reviewed: 09/12/2013 °Elsevier Interactive Patient Education ©2016 [employer] ° °

## 2015-08-16 NOTE — ED Provider Notes (Signed)
CSN: [id number]     Arrival date & time 08/16/15  1023 History   First MD Initiated Contact with Patient 08/16/15 1123     Chief Complaint  Patient presents with  . Sore Throat  . Cough   HPI  Sonya Page is a 35 year old female presenting with sore throat and cough. She reports onset of sore throat earlier this week. She states that she has a son at home who had a positive rapid strep and positive flu. She was seen by her primary care provider yesterday with a negative rapid strep and negative flu swab. She states that her sore throat has slightly improved since onset. Denies difficulty handling secretions, swallowing or breathing. She states that overnight her cough has worsened. She is complaining of associated chest congestion and yellow sputum production. She reports a history of "asthma bronchitis". She states that "in the past, when it gets bad they have to hospitalize me because I start wheezing and I don't want that to happen". She states she is here to "get antibiotics and prednisone before it gets bad". She reports subjective fevers over the past week. At her family medicine doctor yesterday, she had a fever of 100.7. Her fever reduces appropriately with Tylenol or Motrin. She has not used any other home medications. She states that she is worried she is going to start wheezing but she has not experienced wheezing over the past week. She has inhalers at home to use when necessary and has not used them as of yet. Denies chills, myalgias, arthralgias, headache, ear pain, eye redness, eye discharge, nasal congestion, rhinorrhea, difficulty swallowing, difficulty breathing, shortness of breath, chest pain, abdominal pain, nausea, vomiting or diarrhea.  Past Medical History  Diagnosis Date  . Pseudotumor cerebri   . 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 Anxiety state, unspecified   . Pure hypercholesterolemia   . Palpitations 11/2013    moderate per pt.  . Leg pain   .  Diarrhea   . PVC (premature ventricular contraction)   . Premature beats, unspecified     Isolated PVC on 12/28/13 EKG, probable cause of fluttering  . Asthma    Past Surgical History  Procedure Laterality Date  . Cesarean section  2010   History reviewed. No pertinent family history. Social History  Substance Use Topics  . Smoking status: Current Every Day Smoker -- 0.50 packs/day    Types: Cigarettes  . Smokeless tobacco: None  . Alcohol Use: Yes     Comment: holidays   OB History    No data available     Review of Systems  Constitutional: Positive for fever and chills.  HENT: Positive for sore throat. Negative for congestion, rhinorrhea and trouble swallowing.   Eyes: Negative for discharge and redness.  Respiratory: Positive for cough. Negative for shortness of breath and wheezing.   Cardiovascular: Negative for chest pain.  Gastrointestinal: Negative for nausea, vomiting, abdominal pain and diarrhea.  Musculoskeletal: Negative for myalgias, arthralgias and neck pain.  Skin: Negative for rash.  Neurological: Negative for dizziness, syncope and headaches.  All other systems reviewed and are negative.     Allergies  Penicillins  Home Medications   Prior to Admission medications   Medication Sig Start Date End Date Taking? Authorizing Provider  azithromycin (ZITHROMAX) 250 MG tablet Take 1 tablet (250 mg total) by mouth daily. Take first 2 tablets together, then 1 every day until finished. 08/16/15   Marialuiza Car, PA-C  ClonazePAM (KLONOPIN PO)  Take by mouth.    Historical Provider, MD  medroxyPROGESTERone (DEPO-PROVERA) 150 MG/ML injection Inject 150 mg into the muscle every 3 (three) months.    Historical Provider, MD  valACYclovir (VALTREX) 500 MG tablet Take 500 mg by mouth daily.    Historical Provider, MD   BP 120/77 mmHg  Pulse 98  Temp(Src) 98.7 F (37.1 C) (Oral)  Resp 20  Ht 5\' 3"  (1.6 m)  Wt 116.574 kg  BMI 45.54 kg/m2  SpO2 99% Physical Exam   Constitutional: She appears well-developed and well-nourished. No distress.  HENT:  Head: Normocephalic and atraumatic.  Mouth/Throat: Oropharynx is clear and moist. No oropharyngeal exudate.  Eyes: Conjunctivae are normal. Right eye exhibits no discharge. Left eye exhibits no discharge. No scleral icterus.  Neck: Normal range of motion. Neck supple.  Cardiovascular: Normal rate, regular rhythm and normal heart sounds.   Pulmonary/Chest: Effort normal and breath sounds normal. No respiratory distress. She has no wheezes.  Breathing unlabored. Lungs CTAB. No wheezing  Musculoskeletal: Normal range of motion.  Lymphadenopathy:    She has no cervical adenopathy.  Neurological: She is alert. Coordination normal.  Skin: Skin is warm and dry.  Psychiatric: She has a normal mood and affect. Her behavior is normal.  Nursing note and vitals reviewed.   ED Course  Procedures (including critical care time) Labs Review Labs Reviewed - No data to display  Imaging Review Dg Chest 2 View  08/16/2015  CLINICAL DATA:  Two day history of cough, fever, chest congestion and chest soreness. Smoker with current history of asthma. EXAM: CHEST  2 VIEW COMPARISON:  02/25/2012 and earlier. FINDINGS: Cardiomediastinal silhouette unremarkable, unchanged. Lungs clear. Bronchovascular markings normal. Pulmonary vascularity normal. No visible pleural effusions. No pneumothorax. Visualized bony thorax intact. No significant interval change. IMPRESSION: No acute cardiopulmonary disease.  Stable examination. Electronically Signed   By: Evangeline Dakin M.D.   On: 08/16/2015 12:54   I have personally reviewed and evaluated these images and lab results as part of my medical decision-making.   EKG Interpretation None      MDM   Final diagnoses:  Cough  URI (upper respiratory infection)   35 year old female presenting with fevers, sore throat and cough x one week. Sore throat and intermittent fevers have been  present for the past week with a cough developing 2 days ago. Complaining of chest congestion and productive of yellow sputum. Seen by PCP yesterday with negative rapid strep and flu swab. Afebrile and nontoxic appearing. No oropharyngeal erythema or exudate. No nasal congestion. Breathing unlabored without wheezing. Chest x-ray negative. Patient presentation consistent with URI. Discussed with patient that URI is likely viral and does not require abx. Pt extremely upset and states "I came for antibiotics, that's it". Discussed with Dr. Roderic Palau. Will discharge patient with zpack. Long discussion with patient about holding off on filling prescription unless symptoms continue to worsen. Pt states understanding. Also encouraged to use inhalers at home if wheezing begins. Pt states she does not need refills on these. Also discussed other symptomatic care. Pt is to follow up with PCP early next week if symptoms do not improve. Return precautions given in discharge paperwork and discussed with pt at bedside. Pt stable for discharge    Josephina Gip, PA-C 08/16/15 [place], MD 08/17/15 [phone]

## 2015-08-17 ENCOUNTER — Emergency Department (HOSPITAL_COMMUNITY): Payer: BC Managed Care – PPO

## 2015-08-17 ENCOUNTER — Encounter (HOSPITAL_COMMUNITY): Payer: Self-pay

## 2015-08-17 ENCOUNTER — Observation Stay (HOSPITAL_COMMUNITY)
Admission: EM | Admit: 2015-08-17 | Discharge: 2015-08-19 | Disposition: A | Discharge status: Home / Self Care | Payer: BC Managed Care – PPO | Attending: Internal Medicine | Admitting: Internal Medicine

## 2015-08-17 DIAGNOSIS — D72829 Elevated white blood cell count, unspecified: Secondary | ICD-10-CM | POA: Insufficient documentation

## 2015-08-17 DIAGNOSIS — Z6841 Body Mass Index (BMI) 40.0 and over, adult: Secondary | ICD-10-CM | POA: Insufficient documentation

## 2015-08-17 DIAGNOSIS — Z720 Tobacco use: Secondary | ICD-10-CM | POA: Diagnosis present

## 2015-08-17 DIAGNOSIS — F17210 Nicotine dependence, cigarettes, uncomplicated: Secondary | ICD-10-CM | POA: Insufficient documentation

## 2015-08-17 DIAGNOSIS — J209 Acute bronchitis, unspecified: Secondary | ICD-10-CM | POA: Diagnosis present

## 2015-08-17 DIAGNOSIS — T380X5A Adverse effect of glucocorticoids and synthetic analogues, initial encounter: Secondary | ICD-10-CM | POA: Insufficient documentation

## 2015-08-17 DIAGNOSIS — F419 Anxiety disorder, unspecified: Secondary | ICD-10-CM | POA: Diagnosis not present

## 2015-08-17 DIAGNOSIS — R002 Palpitations: Secondary | ICD-10-CM | POA: Diagnosis not present

## 2015-08-17 DIAGNOSIS — J45901 Unspecified asthma with (acute) exacerbation: Principal | ICD-10-CM | POA: Diagnosis present

## 2015-08-17 DIAGNOSIS — J101 Influenza due to other identified influenza virus with other respiratory manifestations: Secondary | ICD-10-CM | POA: Diagnosis present

## 2015-08-17 DIAGNOSIS — R000 Tachycardia, unspecified: Secondary | ICD-10-CM | POA: Insufficient documentation

## 2015-08-17 DIAGNOSIS — Z79899 Other long term (current) drug therapy: Secondary | ICD-10-CM | POA: Diagnosis not present

## 2015-08-17 DIAGNOSIS — R0602 Shortness of breath: Secondary | ICD-10-CM | POA: Diagnosis present

## 2015-08-17 DIAGNOSIS — R739 Hyperglycemia, unspecified: Secondary | ICD-10-CM | POA: Insufficient documentation

## 2015-08-17 DIAGNOSIS — E7800 Pure hypercholesterolemia, unspecified: Secondary | ICD-10-CM | POA: Diagnosis not present

## 2015-08-17 DIAGNOSIS — E785 Hyperlipidemia, unspecified: Secondary | ICD-10-CM | POA: Insufficient documentation

## 2015-08-17 LAB — COMPREHENSIVE METABOLIC PANEL
ALBUMIN: 4.2 g/dL (ref 3.5–5.0)
ALT: 31 U/L (ref 14–54)
AST: 36 U/L (ref 15–41)
Alkaline Phosphatase: 68 U/L (ref 38–126)
Anion gap: 17 — ABNORMAL HIGH (ref 5–15)
BUN: 11 mg/dL (ref 6–20)
CHLORIDE: 108 mmol/L (ref 101–111)
CO2: 14 mmol/L — AB (ref 22–32)
Calcium: 9.1 mg/dL (ref 8.9–10.3)
Creatinine, Ser: 1.24 mg/dL — ABNORMAL HIGH (ref 0.44–1.00)
GFR calc Af Amer: >60 mL/min (ref >60)
GFR calc non Af Amer: 57 mL/min — ABNORMAL LOW (ref >60)
GLUCOSE: 113 mg/dL — AB (ref 65–99)
POTASSIUM: 5.1 mmol/L (ref 3.5–5.1)
SODIUM: 139 mmol/L (ref 135–145)
Total Bilirubin: 1.3 mg/dL — ABNORMAL HIGH (ref 0.3–1.2)
Total Protein: 8.1 g/dL (ref 6.5–8.1)

## 2015-08-17 LAB — I-STAT CG4 LACTIC ACID, ED: LACTIC ACID, VENOUS: 1.34 mmol/L (ref 0.5–2.0)

## 2015-08-17 LAB — BRAIN NATRIURETIC PEPTIDE: B NATRIURETIC PEPTIDE 5: 8.7 pg/mL (ref 0.0–100.0)

## 2015-08-17 LAB — CBC WITH DIFFERENTIAL/PLATELET
BASOS ABS: 0 10*3/uL (ref 0.0–0.1)
BASOS PCT: 0 %
Eosinophils Absolute: 0 10*3/uL (ref 0.0–0.7)
Eosinophils Relative: 0 %
HEMATOCRIT: 44.3 % (ref 36.0–46.0)
Hemoglobin: 14.5 g/dL (ref 12.0–15.0)
Lymphocytes Relative: 5 %
Lymphs Abs: 0.6 10*3/uL — ABNORMAL LOW (ref 0.7–4.0)
MCH: 27.6 pg (ref 26.0–34.0)
MCHC: 32.7 g/dL (ref 30.0–36.0)
MCV: 84.4 fL (ref 78.0–100.0)
MONO ABS: 0.9 10*3/uL (ref 0.1–1.0)
Monocytes Relative: 7 %
NEUTROS ABS: 10.9 10*3/uL — AB (ref 1.7–7.7)
NEUTROS PCT: 88 %
PLATELETS: 252 10*3/uL (ref 150–400)
RBC: 5.25 MIL/uL — AB (ref 3.87–5.11)
RDW: 12.7 % (ref 11.5–15.5)
WBC: 12.4 10*3/uL — AB (ref 4.0–10.5)

## 2015-08-17 LAB — D-DIMER, QUANTITATIVE (NOT AT ARMC): D DIMER QUANT: 1.61 ug{FEU}/mL — AB (ref 0.00–0.50)

## 2015-08-17 LAB — MAGNESIUM: MAGNESIUM: 2 mg/dL (ref 1.7–2.4)

## 2015-08-17 LAB — RAPID STREP SCREEN (MED CTR MEBANE ONLY): STREPTOCOCCUS, GROUP A SCREEN (DIRECT): NEGATIVE

## 2015-08-17 LAB — TSH: TSH: 1.078 u[IU]/mL (ref 0.350–4.500)

## 2015-08-17 LAB — PHOSPHORUS: PHOSPHORUS: 2.9 mg/dL (ref 2.5–4.6)

## 2015-08-17 LAB — TROPONIN I: Troponin I: <0.03 ng/mL (ref <0.031)

## 2015-08-17 MED ORDER — MAGNESIUM SULFATE 2 GM/50ML IV SOLN
2.0000 g | Freq: Once | INTRAVENOUS | Status: AC
  Administered 2015-08-17: 2 g via INTRAVENOUS
  Filled 2015-08-17: qty 50

## 2015-08-17 MED ORDER — LEVALBUTEROL HCL 1.25 MG/0.5ML IN NEBU: 1.2500 mg | INHALATION_SOLUTION | Freq: Once | RESPIRATORY_TRACT | Status: DC

## 2015-08-17 MED ORDER — PANTOPRAZOLE SODIUM 40 MG IV SOLR: 40.0000 mg | INTRAVENOUS | Status: DC

## 2015-08-17 MED ORDER — PANTOPRAZOLE SODIUM 40 MG PO TBEC
40.0000 mg | DELAYED_RELEASE_TABLET | Freq: Every day | ORAL | Status: DC
  Administered 2015-08-17 – 2015-08-19 (×3): 40 mg via ORAL
  Filled 2015-08-17 (×3): qty 1

## 2015-08-17 MED ORDER — MENTHOL 3 MG MT LOZG
1.0000 | LOZENGE | OROMUCOSAL | Status: DC | PRN
  Administered 2015-08-18: 3 mg via ORAL
  Filled 2015-08-17: qty 9

## 2015-08-17 MED ORDER — GUAIFENESIN-DM 100-10 MG/5ML PO SYRP
5.0000 mL | ORAL_SOLUTION | ORAL | Status: DC | PRN
  Administered 2015-08-18 (×3): 5 mL via ORAL
  Filled 2015-08-17 (×3): qty 10

## 2015-08-17 MED ORDER — PANTOPRAZOLE SODIUM 40 MG IV SOLR
40.0000 mg | INTRAVENOUS | Status: DC
  Filled 2015-08-17: qty 40

## 2015-08-17 MED ORDER — ACETAMINOPHEN 325 MG PO TABS: 650.0000 mg | ORAL_TABLET | Freq: Four times a day (QID) | ORAL | Status: DC | PRN

## 2015-08-17 MED ORDER — METHYLPREDNISOLONE SODIUM SUCC 125 MG IJ SOLR
125.0000 mg | Freq: Once | INTRAMUSCULAR | Status: AC
  Administered 2015-08-17: 125 mg via INTRAVENOUS
  Filled 2015-08-17: qty 2

## 2015-08-17 MED ORDER — ONDANSETRON HCL 4 MG PO TABS: 4.0000 mg | ORAL_TABLET | Freq: Four times a day (QID) | ORAL | Status: DC | PRN

## 2015-08-17 MED ORDER — LEVOFLOXACIN IN D5W 750 MG/150ML IV SOLN
750.0000 mg | INTRAVENOUS | Status: DC
  Administered 2015-08-17: 750 mg via INTRAVENOUS
  Filled 2015-08-17: qty 150

## 2015-08-17 MED ORDER — SODIUM CHLORIDE 0.9% FLUSH
3.0000 mL | Freq: Two times a day (BID) | INTRAVENOUS | Status: DC
  Administered 2015-08-17 – 2015-08-18 (×2): 3 mL via INTRAVENOUS

## 2015-08-17 MED ORDER — IBUPROFEN 200 MG PO TABS
400.0000 mg | ORAL_TABLET | Freq: Once | ORAL | Status: AC
  Administered 2015-08-17: 400 mg via ORAL
  Filled 2015-08-17: qty 2

## 2015-08-17 MED ORDER — IOHEXOL 350 MG/ML SOLN
100.0000 mL | Freq: Once | INTRAVENOUS | Status: AC | PRN
  Administered 2015-08-17: 100 mL via INTRAVENOUS

## 2015-08-17 MED ORDER — LEVALBUTEROL HCL 1.25 MG/0.5ML IN NEBU
1.2500 mg | INHALATION_SOLUTION | Freq: Once | RESPIRATORY_TRACT | Status: AC
  Administered 2015-08-17: 1.25 mg via RESPIRATORY_TRACT
  Filled 2015-08-17: qty 0.5

## 2015-08-17 MED ORDER — ALBUTEROL (5 MG/ML) CONTINUOUS INHALATION SOLN
10.0000 mg/h | INHALATION_SOLUTION | Freq: Once | RESPIRATORY_TRACT | Status: DC
Start: 2015-08-17 — End: 2015-08-17

## 2015-08-17 MED ORDER — AZITHROMYCIN 250 MG PO TABS: 250.0000 mg | ORAL_TABLET | Freq: Every day | ORAL | Status: DC

## 2015-08-17 MED ORDER — CLONAZEPAM 0.5 MG PO TABS
0.5000 mg | ORAL_TABLET | Freq: Two times a day (BID) | ORAL | Status: DC | PRN
  Administered 2015-08-18 (×2): 0.5 mg via ORAL
  Filled 2015-08-17 (×2): qty 1

## 2015-08-17 MED ORDER — LEVALBUTEROL HCL 0.63 MG/3ML IN NEBU
0.6300 mg | INHALATION_SOLUTION | Freq: Four times a day (QID) | RESPIRATORY_TRACT | Status: DC | PRN
  Administered 2015-08-18: 0.63 mg via RESPIRATORY_TRACT
  Filled 2015-08-17: qty 3

## 2015-08-17 MED ORDER — IPRATROPIUM BROMIDE 0.02 % IN SOLN
0.5000 mg | Freq: Three times a day (TID) | RESPIRATORY_TRACT | Status: DC
  Administered 2015-08-18 – 2015-08-19 (×4): 0.5 mg via RESPIRATORY_TRACT
  Filled 2015-08-17 (×4): qty 2.5

## 2015-08-17 MED ORDER — GUAIFENESIN ER 600 MG PO TB12
600.0000 mg | ORAL_TABLET | Freq: Two times a day (BID) | ORAL | Status: DC
  Administered 2015-08-17 – 2015-08-19 (×4): 600 mg via ORAL
  Filled 2015-08-17 (×4): qty 1

## 2015-08-17 MED ORDER — KETOROLAC TROMETHAMINE 30 MG/ML IJ SOLN
30.0000 mg | Freq: Once | INTRAMUSCULAR | Status: AC
  Administered 2015-08-17: 30 mg via INTRAVENOUS
  Filled 2015-08-17: qty 1

## 2015-08-17 MED ORDER — METHYLPREDNISOLONE SODIUM SUCC 125 MG IJ SOLR
80.0000 mg | Freq: Four times a day (QID) | INTRAMUSCULAR | Status: DC
  Administered 2015-08-17 – 2015-08-18 (×3): 80 mg via INTRAVENOUS
  Filled 2015-08-17 (×4): qty 1.28

## 2015-08-17 MED ORDER — IPRATROPIUM BROMIDE 0.02 % IN SOLN
0.5000 mg | Freq: Four times a day (QID) | RESPIRATORY_TRACT | Status: DC
  Administered 2015-08-17: 0.5 mg via RESPIRATORY_TRACT
  Filled 2015-08-17: qty 2.5

## 2015-08-17 MED ORDER — ENOXAPARIN SODIUM 60 MG/0.6ML ~~LOC~~ SOLN
55.0000 mg | SUBCUTANEOUS | Status: DC
  Administered 2015-08-17 – 2015-08-18 (×2): 55 mg via SUBCUTANEOUS
  Filled 2015-08-17 (×2): qty 0.6

## 2015-08-17 MED ORDER — ONDANSETRON HCL 4 MG/2ML IJ SOLN: 4.0000 mg | Freq: Four times a day (QID) | INTRAMUSCULAR | Status: DC | PRN

## 2015-08-17 MED ORDER — LEVALBUTEROL HCL 0.63 MG/3ML IN NEBU: 0.6300 mg | INHALATION_SOLUTION | Freq: Four times a day (QID) | RESPIRATORY_TRACT | Status: DC | PRN

## 2015-08-17 MED ORDER — ACETAMINOPHEN 325 MG PO TABS
650.0000 mg | ORAL_TABLET | Freq: Once | ORAL | Status: AC
  Administered 2015-08-17: 650 mg via ORAL
  Filled 2015-08-17: qty 2

## 2015-08-17 MED ORDER — LEVALBUTEROL HCL 0.63 MG/3ML IN NEBU
0.6300 mg | INHALATION_SOLUTION | Freq: Three times a day (TID) | RESPIRATORY_TRACT | Status: DC
  Administered 2015-08-18 – 2015-08-19 (×3): 0.63 mg via RESPIRATORY_TRACT
  Filled 2015-08-17 (×4): qty 3

## 2015-08-17 MED ORDER — VITAMIN D (ERGOCALCIFEROL) 1.25 MG (50000 UNIT) PO CAPS
50000.0000 [IU] | ORAL_CAPSULE | ORAL | Status: DC
  Administered 2015-08-18: 50000 [IU] via ORAL
  Filled 2015-08-17: qty 1

## 2015-08-17 MED ORDER — SODIUM CHLORIDE 0.9 % IV SOLN
INTRAVENOUS | Status: DC
  Administered 2015-08-17 – 2015-08-19 (×4): via INTRAVENOUS

## 2015-08-17 MED ORDER — LEVALBUTEROL HCL 0.63 MG/3ML IN NEBU
0.6300 mg | INHALATION_SOLUTION | Freq: Four times a day (QID) | RESPIRATORY_TRACT | Status: DC
  Administered 2015-08-17: 0.63 mg via RESPIRATORY_TRACT
  Filled 2015-08-17: qty 3

## 2015-08-17 NOTE — ED Notes (Addendum)
Pt returned to room from CT scan with report of unable to do test d/t IV infiltrated. Will have another nurse to attempt IV via [place].

## 2015-08-17 NOTE — H&P (Signed)
[facility] History and Physical  SUSA BARIBAULT [id number] DOB: Feb 01, 1981 DOA: 08/17/2015  Referring physician:  Ilona Sorrel, M.D. PCP: No PCP Per Patient   Chief Complaint:  Shortness of breath.  HPI: Sonya Page is a 35 y.o. female with  Past medical history of asthma,  Pseudotumor cerebri, anxiety, hyperlipidemia, palpitations, morbid obesity who comes emergency department with above complaint.  Per patient, her son was diagnosed with stress though an influenza earlier this week. She subsequently noticed that she was developing sore throat, who was followed by productive cough, mild headache and body aches. Since Thursday her cough started worsening and she developed dyspnea with wheezing. She was seen yesterday in the emergency department and was given Zithromax with instructions to hold the prescriptions and follow-up symptoms, but she filled the prescription due to concern that her symptoms were worsening and has taken 1 day dose of it. Today, on presentation to the emergency department, she was febrile, tachypneic, tachycardic and was wheezing.   Today in the emergency department workup shows mild leukocytosis, mildly elevated d-dimer, but normal imaging. She has received methylprednisolone IV, Xopenex and supplemental oxygen. She states that she feels better, but became tachypneic and mildly hypoxic on ambulation. We are admitting her to observation to continue treatment.   Review of Systems:  Constitutional:   Positive Fevers, chills, fatigue.  No weight loss, night sweats, HEENT:  No headaches, Difficulty swallowing,Tooth/dental problems,Sore throat,  No sneezing, itching, ear ache, nasal congestion, post nasal drip,  Cardio-vascular:  No chest pain, Orthopnea, PND, swelling in lower extremities, anasarca, dizziness, palpitations  GI:  No heartburn, indigestion, abdominal pain, nausea, vomiting, diarrhea, change in bowel habits, loss of appetite  Resp:    Positive dyspnea, productive cough, wheezing. No hemoptysis. Skin:  no rash or lesions.  GU:  no dysuria, change in color of urine, no urgency or frequency. No flank pain.  Musculoskeletal:  No joint pain or swelling. No decreased range of motion. No back pain.  Psych:  No change in mood or affect. No depression or anxiety. No memory loss.   Past Medical History  Diagnosis Date  . Pseudotumor cerebri   . 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 Anxiety state, unspecified   . Pure hypercholesterolemia   . Palpitations 11/2013    moderate per pt.  . Leg pain   . Diarrhea   . PVC (premature ventricular contraction)   . Premature beats, unspecified     Isolated PVC on 12/28/13 EKG, probable cause of fluttering  . Asthma    Past Surgical History  Procedure Laterality Date  . Cesarean section  2010   Social History:  reports that she has been smoking Cigarettes.  She has been smoking about 0.50 packs per day. She does not have any smokeless tobacco history on file. She reports that she drinks alcohol. She reports that she does not use illicit drugs.  Allergies  Allergen Reactions  . Penicillins         History reviewed. No pertinent family history.  Prior to Admission medications   Medication Sig Start Date End Date Taking? Authorizing Provider  azithromycin (ZITHROMAX) 250 MG tablet Take 1 tablet (250 mg total) by mouth daily. Take first 2 tablets together, then 1 every day until finished. 08/16/15  Yes Stevi Barrett, PA-C  Chlorphen-PE-Acetaminophen (NOREL AD) 4-10-325 MG TABS Take 1 tablet by mouth 3 (three) times daily as needed (cold symptoms).  08/15/15  Yes Historical Provider, MD  clonazePAM (  KLONOPIN) 0.5 MG tablet Take 0.5 mg by mouth 2 (two) times daily as needed for anxiety.   Yes Historical Provider, MD  Vitamin D, Ergocalciferol, (DRISDOL) 50000 units CAPS capsule Take 50,000 Units by mouth 2 (two) times a week.  07/18/15  Yes Historical Provider, MD   MedroxyPROGESTERone Acetate 150 MG/ML SUSY Inject 150 mg into the muscle every 3 (three) months. 07/02/15   Historical Provider, MD   Physical Exam: Filed Vitals:   08/17/15 1730 08/17/15 1837 08/17/15 1912 08/17/15 1918  BP:  114/71 116/95   Pulse: 128 120 128   Temp:    100.1 F (37.8 C)  TempSrc:    Oral  Resp: 23  18   SpO2: 95% 96% 95%     Wt Readings from Last 3 Encounters:  08/16/15 116.574 kg (257 lb)  05/31/12 113.399 kg (250 lb)    General:  Mildly anxious. Eyes: PERRL, normal lids, irises & conjunctiva ENT: grossly normal hearing, lips & tongue. Neck: no LAD, masses or thyromegaly Cardiovascular: Tachycardic, no m/r/g. No LE edema. Telemetry:  Sinus tachycardia. Respiratory:  Decreased breath sounds bilaterally. Bilateral wheezing and scattered rhonchi.                             No accessory muscle use. Abdomen: soft, ntnd Skin: no rash or induration seen on limited exam Musculoskeletal: grossly normal tone BUE/BLE Psychiatric: grossly normal mood and affect, speech fluent and appropriate Neurologic:  Awake, alert, oriented 3,grossly non-focal.          Labs on Admission:  Basic Metabolic Panel:  Recent Labs Lab 08/17/15 1006  NA 139  K 5.1  CL 108  CO2 14*  GLUCOSE 113*  BUN 11  CREATININE 1.24*  CALCIUM 9.1   Liver Function Tests:  Recent Labs Lab 08/17/15 1006  AST 36  ALT 31  ALKPHOS 68  BILITOT 1.3*  PROT 8.1  ALBUMIN 4.2   CBC:  Recent Labs Lab 08/17/15 1006  WBC 12.4*  NEUTROABS 10.9*  HGB 14.5  HCT 44.3  MCV 84.4  PLT 252   Cardiac Enzymes:  Recent Labs Lab 08/17/15 1006  TROPONINI <0.03    BNP (last 3 results)  Recent Labs  08/17/15 1006  BNP 8.7     Radiological Exams on Admission: Dg Chest 2 View  08/16/2015  CLINICAL DATA:  Two day history of cough, fever, chest congestion and chest soreness. Smoker with current history of asthma. EXAM: CHEST  2 VIEW COMPARISON:  02/25/2012 and earlier. FINDINGS:  Cardiomediastinal silhouette unremarkable, unchanged. Lungs clear. Bronchovascular markings normal. Pulmonary vascularity normal. No visible pleural effusions. No pneumothorax. Visualized bony thorax intact. No significant interval change. IMPRESSION: No acute cardiopulmonary disease.  Stable examination. Electronically Signed   By: Evangeline Dakin M.D.   On: 08/16/2015 12:54   Ct Angio Chest Pe W/cm &/or Wo Cm  08/17/2015  CLINICAL DATA:  Shortness of breath for several days, initial encounter EXAM: CT ANGIOGRAPHY CHEST WITH CONTRAST TECHNIQUE: Multidetector CT imaging of the chest was performed using the standard protocol during bolus administration of intravenous contrast. Multiplanar CT image reconstructions and MIPs were obtained to evaluate the vascular anatomy. CONTRAST:  17mL OMNIPAQUE IOHEXOL 350 MG/ML SOLN COMPARISON:  None. FINDINGS: Lungs are well aerated bilaterally.  No focal infiltrate is seen. The thoracic inlet is within normal limits. The thoracic aorta and pulmonary artery are well visualized. No filling defects to suggest pulmonary emboli are seen. No  hilar or mediastinal adenopathy is noted. The visualized upper abdomen reveals no acute abnormality. No bony abnormality is seen. Review of the MIP images confirms the above findings. IMPRESSION: No acute abnormality noted. Electronically Signed   By: Inez Catalina M.D.   On: 08/17/2015 16:41   Dg Chest Portable 1 View  08/17/2015  CLINICAL DATA:  35 year old presenting with 1 day history of cough, shortness of breath and sore throat. Current smoker with asthma. EXAM: PORTABLE CHEST 1 VIEW COMPARISON:  08/16/2015 and earlier. FINDINGS: Suboptimal inspiration due to body habitus accounts for crowded bronchovascular markings, especially in the bases, and accentuates the cardiac silhouette. Taking this into account, cardiomediastinal silhouette unremarkable. Lungs clear. Bronchovascular markings normal. Pulmonary vascularity normal. No visible  pleural effusions. No pneumothorax. IMPRESSION: Suboptimal inspiration. No acute cardiopulmonary disease. No change since yesterday. Electronically Signed   By: Evangeline Dakin M.D.   On: 08/17/2015 10:30    EKG: Independently reviewed. Vent. rate 137 BPM PR interval 87 ms QRS duration 84 ms QT/QTc 286/432 ms P-R-T axes 74 81 55 Sinus tachycardia Probable left atrial enlargement  Assessment/Plan Principal Problem:   Asthma exacerbation   Acute bronchitis Admit to observation/telemetry. Continue supplemental oxygen. Continue Xopenex nebulizer treatments Add ipratropium nebulization. Start Levaquin 750 mg IVP every 24 hours. Tylenol and Toradol for fever.  Active Problems:   Anxiety Avoid albuterol, since the patient states it gives her bad anxiety. Continue when necessary low-dose clonazepam. Patient to follow-up with her PCP for further management.    Heart palpitations   Tachycardia. Per patient, she used to be on metoprolol, but discontinued it due to hypotension. Continue cardiac telemetry. Continue gentle hydration. Check echocardiogram in the morning. Magnesium sulfate 2 g IVPB.      Tobacco abuse disorder Declined nicotine replacement therapy.     Code Status:  Full code. DVT Prophylaxis:  Lovenox SQ. Family Communication:  Disposition Plan:  Admit to telemetry for asthma exacerbation treatment , monitoring and evaluation of tachycardia.   Time spent:  Over 70 minutes were spent in the process of his admission.    Reubin Milan, M.D. [facility] Pager [phone].

## 2015-08-17 NOTE — ED Notes (Signed)
Portable CXR at bedside.

## 2015-08-17 NOTE — ED Notes (Signed)
Patient transported to CT 

## 2015-08-17 NOTE — ED Notes (Signed)
RN to start IV and draw blood, will follow up with this tech if assistance needed

## 2015-08-17 NOTE — ED Provider Notes (Signed)
Patient care transferred to me. CT scan shows no pneumonia or pulmonary embolism. So short of breath, allows me to treat her with Xopenex but even after that she is still short of breath, especially with exertion. O2 sats down to 90% with ambulation. Finally will be treated with albuterol, will attempt continuous albuterol, swab for influenza, and admit to the hospitalist for significant asthma exacerbation. She is improving from initial presentation but still not well enough to go home.  Sherwood Gambler, MD 08/18/15 [phone]

## 2015-08-17 NOTE — ED Notes (Signed)
Ambulated pt around hall. Pt O2 started @ 95, by the time we came back to room pt stated she was winded and her O2 was down to 91.

## 2015-08-17 NOTE — ED Notes (Signed)
Pt with shortness of breath since Thursday.  Cough with sore throat.  Went to MD to r/o strep.  Negative.  No antibiotics given.  Unknown for fever.  Tachycardic in triage

## 2015-08-17 NOTE — ED Provider Notes (Signed)
CSN: [id number]     Arrival date & time 08/17/15  [id number] History   First MD Initiated Contact with Patient 08/17/15 [phone]     Chief Complaint  Patient presents with  . Shortness of Breath      HPI Pt presents with worsening SOB since last night. Viral symptoms through the week. Tested negative for strep and flu at pcp office. No hx of DVT or PE. Pt has not tried albuterol at home. Denies CP. No other complaints at this time. Presents from triage with a HR of 150. She reports 110 is a typical HR for her. Reports decreased oral intake today. SOB is moderate in severity. Has a temp of 103.2.  Given Tylenol by triage.   Past Medical History  Diagnosis Date  . Pseudotumor cerebri   . 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 Anxiety state, unspecified   . Pure hypercholesterolemia   . Palpitations 11/2013    moderate per pt.  . Leg pain   . Diarrhea   . PVC (premature ventricular contraction)   . Premature beats, unspecified     Isolated PVC on 12/28/13 EKG, probable cause of fluttering  . Asthma    Past Surgical History  Procedure Laterality Date  . Cesarean section  2010   History reviewed. No pertinent family history. Social History  Substance Use Topics  . Smoking status: Current Every Day Smoker -- 0.50 packs/day    Types: Cigarettes  . Smokeless tobacco: None  . Alcohol Use: Yes     Comment: holidays   OB History    No data available     Review of Systems  All other systems reviewed and are negative.     Allergies  Penicillins  Home Medications   Prior to Admission medications   Medication Sig Start Date End Date Taking? Authorizing Provider  azithromycin (ZITHROMAX) 250 MG tablet Take 1 tablet (250 mg total) by mouth daily. Take first 2 tablets together, then 1 every day until finished. 08/16/15  Yes Stevi Barrett, PA-C  Chlorphen-PE-Acetaminophen (NOREL AD) 4-10-325 MG TABS Take 1 tablet by mouth 3 (three) times daily as needed (cold symptoms).   08/15/15  Yes Historical Provider, MD  clonazePAM (KLONOPIN) 0.5 MG tablet Take 0.5 mg by mouth 2 (two) times daily as needed for anxiety.   Yes Historical Provider, MD  Vitamin D, Ergocalciferol, (DRISDOL) 50000 units CAPS capsule Take 50,000 Units by mouth 2 (two) times a week.  07/18/15  Yes Historical Provider, MD  MedroxyPROGESTERone Acetate 150 MG/ML SUSY Inject 150 mg into the muscle every 3 (three) months. 07/02/15   Historical Provider, MD   BP 129/111 mmHg  Pulse 125  Temp(Src) 103.2 F (39.6 C) (Rectal)  Resp 18  SpO2 95% Physical Exam  Constitutional: She is oriented to person, place, and time. She appears well-developed and well-nourished. No distress.  HENT:  Head: Normocephalic and atraumatic.  Eyes: EOM are normal.  Neck: Normal range of motion.  Cardiovascular: Regular rhythm and normal heart sounds.   tachycardia  Pulmonary/Chest: Effort normal. She has wheezes.  Abdominal: Soft. She exhibits no distension. There is no tenderness.  Musculoskeletal: Normal range of motion. She exhibits no edema or tenderness.  Neurological: She is alert and oriented to person, place, and time.  Skin: Skin is warm and dry.  Psychiatric: She has a normal mood and affect. Judgment normal.  Nursing note and vitals reviewed.   ED Course  Procedures (including critical care time) Labs Review  Labs Reviewed  CBC WITH DIFFERENTIAL/PLATELET - Abnormal; Notable for the following:    WBC 12.4 (*)    RBC 5.25 (*)    Neutro Abs 10.9 (*)    Lymphs Abs 0.6 (*)    All other components within normal limits  D-DIMER, QUANTITATIVE (NOT AT [facility]) - Abnormal; Notable for the following:    D-Dimer, Quant 1.61 (*)    All other components within normal limits  COMPREHENSIVE METABOLIC PANEL - Abnormal; Notable for the following:    CO2 14 (*)    Glucose, Bld 113 (*)    Creatinine, Ser 1.24 (*)    Total Bilirubin 1.3 (*)    GFR calc non Af Amer 57 (*)    Anion gap 17 (*)    All other components  within normal limits  CULTURE, BLOOD (ROUTINE X 2)  CULTURE, BLOOD (ROUTINE X 2)  BRAIN NATRIURETIC PEPTIDE  TROPONIN I  I-STAT CG4 LACTIC ACID, ED    Imaging Review Dg Chest 2 View  08/16/2015  CLINICAL DATA:  Two day history of cough, fever, chest congestion and chest soreness. Smoker with current history of asthma. EXAM: CHEST  2 VIEW COMPARISON:  02/25/2012 and earlier. FINDINGS: Cardiomediastinal silhouette unremarkable, unchanged. Lungs clear. Bronchovascular markings normal. Pulmonary vascularity normal. No visible pleural effusions. No pneumothorax. Visualized bony thorax intact. No significant interval change. IMPRESSION: No acute cardiopulmonary disease.  Stable examination. Electronically Signed   By: Evangeline Dakin M.D.   On: 08/16/2015 12:54   Dg Chest Portable 1 View  08/17/2015  CLINICAL DATA:  35 year old presenting with 1 day history of cough, shortness of breath and sore throat. Current smoker with asthma. EXAM: PORTABLE CHEST 1 VIEW COMPARISON:  08/16/2015 and earlier. FINDINGS: Suboptimal inspiration due to body habitus accounts for crowded bronchovascular markings, especially in the bases, and accentuates the cardiac silhouette. Taking this into account, cardiomediastinal silhouette unremarkable. Lungs clear. Bronchovascular markings normal. Pulmonary vascularity normal. No visible pleural effusions. No pneumothorax. IMPRESSION: Suboptimal inspiration. No acute cardiopulmonary disease. No change since yesterday. Electronically Signed   By: Evangeline Dakin M.D.   On: 08/17/2015 10:30   I have personally reviewed and evaluated these images and lab results as part of my medical   decision-making.   EKG Interpretation   Date/Time:  Sunday August 17 2015 10:54:31 EST Ventricular Rate:  137 PR Interval:  87 QRS Duration: 84 QT Interval:  286 QTC Calculation: 432 R Axis:   81 Text Interpretation:  Sinus tachycardia Probable left atrial enlargement  Confirmed by DELO   MD, DOUGLAS ([id number]) on 08/17/2015 12:44:45 PM      MDM   Final diagnoses:  None    Given degree of SOB and tachycardia the pt underwent d dimer testing. Elevated. Awaiting CT at this time. Likely asthma with bronchospasm but pt is resistant to albuterol because it makes her feel "worse" some times. IV solumedrol now. If CT is negative she is agreeable to a trial of albuterol and atrovent. Decreased oral intake over past 24 hrs. Will initiate IVFs at this time  4:09 PM HR 121. 02 sats on RA 93%  Care to Dr Emiliano Dyer, MD 08/17/15 1640

## 2015-08-18 ENCOUNTER — Observation Stay (HOSPITAL_BASED_OUTPATIENT_CLINIC_OR_DEPARTMENT_OTHER): Payer: BC Managed Care – PPO

## 2015-08-18 DIAGNOSIS — J209 Acute bronchitis, unspecified: Secondary | ICD-10-CM | POA: Diagnosis not present

## 2015-08-18 DIAGNOSIS — R002 Palpitations: Secondary | ICD-10-CM

## 2015-08-18 DIAGNOSIS — Z720 Tobacco use: Secondary | ICD-10-CM

## 2015-08-18 DIAGNOSIS — F419 Anxiety disorder, unspecified: Secondary | ICD-10-CM | POA: Diagnosis not present

## 2015-08-18 DIAGNOSIS — J80 Acute respiratory distress syndrome: Secondary | ICD-10-CM

## 2015-08-18 DIAGNOSIS — J45901 Unspecified asthma with (acute) exacerbation: Secondary | ICD-10-CM | POA: Diagnosis not present

## 2015-08-18 DIAGNOSIS — J101 Influenza due to other identified influenza virus with other respiratory manifestations: Secondary | ICD-10-CM

## 2015-08-18 LAB — INFLUENZA PANEL BY PCR (TYPE A & B)
H1N1 flu by pcr: NOT DETECTED
Influenza A By PCR: POSITIVE — AB
Influenza B By PCR: NEGATIVE

## 2015-08-18 LAB — CBC WITH DIFFERENTIAL/PLATELET
BASOS ABS: 0 10*3/uL (ref 0.0–0.1)
Basophils Relative: 0 %
EOS ABS: 0 10*3/uL (ref 0.0–0.7)
EOS PCT: 0 %
HCT: 41.7 % (ref 36.0–46.0)
Hemoglobin: 14 g/dL (ref 12.0–15.0)
Lymphocytes Relative: 5 %
Lymphs Abs: 0.7 10*3/uL (ref 0.7–4.0)
MCH: 28.2 pg (ref 26.0–34.0)
MCHC: 33.6 g/dL (ref 30.0–36.0)
MCV: 83.9 fL (ref 78.0–100.0)
Monocytes Absolute: 0.3 10*3/uL (ref 0.1–1.0)
Monocytes Relative: 2 %
Neutro Abs: 13.5 10*3/uL — ABNORMAL HIGH (ref 1.7–7.7)
Neutrophils Relative %: 93 %
PLATELETS: 231 10*3/uL (ref 150–400)
RBC: 4.97 MIL/uL (ref 3.87–5.11)
RDW: 12.7 % (ref 11.5–15.5)
WBC: 14.5 10*3/uL — AB (ref 4.0–10.5)

## 2015-08-18 LAB — COMPREHENSIVE METABOLIC PANEL
ALT: 24 U/L (ref 14–54)
AST: 34 U/L (ref 15–41)
Albumin: 3.7 g/dL (ref 3.5–5.0)
Alkaline Phosphatase: 64 U/L (ref 38–126)
Anion gap: 13 (ref 5–15)
BUN: 14 mg/dL (ref 6–20)
CHLORIDE: 108 mmol/L (ref 101–111)
CO2: 17 mmol/L — AB (ref 22–32)
CREATININE: 1.01 mg/dL — AB (ref 0.44–1.00)
Calcium: 8.6 mg/dL — ABNORMAL LOW (ref 8.9–10.3)
GFR calc non Af Amer: >60 mL/min (ref >60)
Glucose, Bld: 161 mg/dL — ABNORMAL HIGH (ref 65–99)
POTASSIUM: 3.8 mmol/L (ref 3.5–5.1)
SODIUM: 138 mmol/L (ref 135–145)
Total Bilirubin: 0.6 mg/dL (ref 0.3–1.2)
Total Protein: 7.7 g/dL (ref 6.5–8.1)

## 2015-08-18 MED ORDER — CETYLPYRIDINIUM CHLORIDE 0.05 % MT LIQD: 7.0000 mL | Freq: Two times a day (BID) | OROMUCOSAL | Status: DC

## 2015-08-18 MED ORDER — IBUPROFEN 200 MG PO TABS
600.0000 mg | ORAL_TABLET | Freq: Four times a day (QID) | ORAL | Status: DC | PRN
  Administered 2015-08-18: 600 mg via ORAL
  Filled 2015-08-18: qty 3

## 2015-08-18 MED ORDER — BENZONATATE 100 MG PO CAPS
100.0000 mg | ORAL_CAPSULE | ORAL | Status: DC | PRN
  Administered 2015-08-18 – 2015-08-19 (×3): 100 mg via ORAL
  Filled 2015-08-18 (×3): qty 1

## 2015-08-18 MED ORDER — METHYLPREDNISOLONE SODIUM SUCC 125 MG IJ SOLR
80.0000 mg | Freq: Two times a day (BID) | INTRAMUSCULAR | Status: AC
  Administered 2015-08-18: 80 mg via INTRAVENOUS
  Filled 2015-08-18: qty 1.28

## 2015-08-18 MED ORDER — PREDNISONE 50 MG PO TABS
60.0000 mg | ORAL_TABLET | Freq: Every day | ORAL | Status: DC
  Administered 2015-08-19: 60 mg via ORAL
  Filled 2015-08-18: qty 1

## 2015-08-18 MED ORDER — OSELTAMIVIR PHOSPHATE 75 MG PO CAPS
75.0000 mg | ORAL_CAPSULE | Freq: Two times a day (BID) | ORAL | Status: DC
  Administered 2015-08-18 – 2015-08-19 (×3): 75 mg via ORAL
  Filled 2015-08-18 (×3): qty 1

## 2015-08-18 NOTE — Progress Notes (Signed)
2D Echocardiogram has been performed.  Tresa Res 08/18/2015, 10:47 AM

## 2015-08-18 NOTE — Progress Notes (Signed)
Progress Note   ROQUEL CARNATHAN [id number] DOB: [birth year] DOA: 08/17/2015 PCP: No PCP Per Patient   Brief Narrative:   Sonya Page is an 35 y.o. female the PMH of asthma, pseudotumor cerebri, anxiety, hyperlipidemia, and morbid obesity who was admitted 08/17/15 with URI symptoms and an asthma exacerbation after her son became sick with influenza.  Assessment/Plan:   Principal Problem:   Asthma exacerbation/acute bronchitis secondary to influenza A - Clinical practice guidelines recommend against empiric antibiotic therapy for the treatment of an asthma exacerbation, because most respiratory infections that trigger an exacerbation of asthma are viral rather than bacteria & in the face of a normal CT of the chest, will discontinue empiric Levaquin. Start Tamiflu. Influenza A+. - Rapid strep screen negative. - Given 2 g of magnesium in the ED. - Continue Mucinex, antitussives, bronchodilators, and Solu-Medrol.  Active Problems:   Steroid-induced hyperglycemia - Mild. Should improve with weaning of steroids.    Anxiety - Continue clonazepam.    Heart palpitations - EKG without arrhythmia. No need for echo or ongoing cardiac monitoring as palpitations are likely related to adrenergic effects of bronchodilators.    Tobacco abuse disorder - Counseling cessation requested per nursing.    DVT Prophylaxis - Lovenox ordered.   Family Communication/Anticipated D/C date and plan/Code Status   Family Communication: No family at the bedside. Disposition Plan/date: Home when respiratory status stable and steroids weaned, possibly 08/19/15. Code Status: Full code.   IV Access:    Peripheral IV   Procedures and diagnostic studies:   Ct Angio Chest Pe W/cm &/or Wo Cm  08/17/2015  CLINICAL DATA:  Shortness of breath for several days, initial encounter EXAM: CT ANGIOGRAPHY CHEST WITH CONTRAST TECHNIQUE: Multidetector CT imaging of the chest was performed using the standard  protocol during bolus administration of intravenous contrast. Multiplanar CT image reconstructions and MIPs were obtained to evaluate the vascular anatomy. CONTRAST:  171mL OMNIPAQUE IOHEXOL 350 MG/ML SOLN COMPARISON:  None. FINDINGS: Lungs are well aerated bilaterally.  No focal infiltrate is seen. The thoracic inlet is within normal limits. The thoracic aorta and pulmonary artery are well visualized. No filling defects to suggest pulmonary emboli are seen. No hilar or mediastinal adenopathy is noted. The visualized upper abdomen reveals no acute abnormality. No bony abnormality is seen. Review of the MIP images confirms the above findings. IMPRESSION: No acute abnormality noted. Electronically Signed   By: Inez Catalina M.D.   On: 08/17/2015 16:41   Dg Chest Portable 1 View  08/17/2015  CLINICAL DATA:  35 year old presenting with 1 day history of cough, shortness of breath and sore throat. Current smoker with asthma. EXAM: PORTABLE CHEST 1 VIEW COMPARISON:  08/16/2015 and earlier. FINDINGS: Suboptimal inspiration due to body habitus accounts for crowded bronchovascular markings, especially in the bases, and accentuates the cardiac silhouette. Taking this into account, cardiomediastinal silhouette unremarkable. Lungs clear. Bronchovascular markings normal. Pulmonary vascularity normal. No visible pleural effusions. No pneumothorax. IMPRESSION: Suboptimal inspiration. No acute cardiopulmonary disease. No change since yesterday. Electronically Signed   By: Evangeline Dakin M.D.   On: 08/17/2015 10:30     Medical Consultants:    None.  Anti-Infectives:   Anti-infectives    Start     Dose/Rate Route Frequency Ordered Stop   08/17/15 2200  levofloxacin (LEVAQUIN) IVPB 750 mg     750 mg 100 mL/hr over 90 Minutes Intravenous Every 24 hours 08/17/15 2100     08/17/15 2115  azithromycin (ZITHROMAX) tablet 250  mg  Status:  Discontinued     250 mg Oral Daily 08/17/15 2100 08/17/15 2113      Subjective:      Sonya Page continues to be short of breath. She occasionally has a cough productive of thick mucus. Says she only gets better when she takes antibiotics. Reassured that her illness is viral.  Objective:    Filed Vitals:   08/17/15 1918 08/17/15 2057 08/17/15 2159 08/18/15 0501  BP:  129/71  112/63  Pulse:  120  108  Temp: 100.1 F (37.8 C) 99.1 F (37.3 C)  99.3 F (37.4 C)  TempSrc: Oral Oral  Oral  Resp:  24  28  Height:  5\' 3"  (1.6 m)    Weight:  113.8 kg (250 lb 14.1 oz)    SpO2:  99% 93% 98%    Intake/Output Summary (Last 24 hours) at 08/18/15 0727 Last data filed at 08/17/15 2300  Gross per 24 hour  Intake    680 ml  Output      0 ml  Net    680 ml   Filed Weights   08/17/15 2057  Weight: 113.8 kg (250 lb 14.1 oz)    Exam: Gen:  NAD Cardiovascular:  RRR, No M/R/G Respiratory:  Lungs coarse with decreased air movement Gastrointestinal:  Abdomen soft, NT/ND, + BS Extremities:  No C/E/C   Data Reviewed:    Labs: Basic Metabolic Panel:  Recent Labs Lab 08/17/15 1006 08/17/15 2105 08/18/15 0510  NA 139  --  138  K 5.1  --  3.8  CL 108  --  108  CO2 14*  --  17*  GLUCOSE 113*  --  161*  BUN 11  --  14  CREATININE 1.24*  --  1.01*  CALCIUM 9.1  --  8.6*  MG  --  2.0  --   PHOS  --  2.9  --    GFR Estimated Creatinine Clearance: 97.2 mL/min (by C-G formula based on Cr of 1.01). Liver Function Tests:  Recent Labs Lab 08/17/15 1006 08/18/15 0510  AST 36 34  ALT 31 24  ALKPHOS 68 64  BILITOT 1.3* 0.6  PROT 8.1 7.7  ALBUMIN 4.2 3.7   CBC:  Recent Labs Lab 08/17/15 1006 08/18/15 0510  WBC 12.4* 14.5*  NEUTROABS 10.9* 13.5*  HGB 14.5 14.0  HCT 44.3 41.7  MCV 84.4 83.9  PLT 252 231   Cardiac Enzymes:  Recent Labs Lab 08/17/15 1006  TROPONINI <0.03   D-Dimer:  Recent Labs  08/17/15 1027  DDIMER 1.61*   Thyroid function studies:  Recent Labs  08/17/15 2105  TSH 1.078   Sepsis Labs:  Recent Labs Lab  08/17/15 1006 08/17/15 1016 08/18/15 0510  WBC 12.4*  --  14.5*  LATICACIDVEN  --  1.34  --    Microbiology Recent Results (from the past 240 hour(s))  Rapid strep screen (not at [facility])     Status: None   Collection Time: 08/17/15 11:29 PM  Result Value Ref Range Status   Streptococcus, Group A Screen (Direct) NEGATIVE NEGATIVE Final    Comment: (NOTE) A Rapid Antigen test may result negative if the antigen level in the sample is below the detection level of this test. The FDA has not cleared this test as a stand-alone test therefore the rapid antigen negative result has reflexed to a Group A Strep culture.      Medications:   . antiseptic oral rinse  7 mL Mouth  Rinse BID  . enoxaparin (LOVENOX) injection  55 mg Subcutaneous Q24H  . guaiFENesin  600 mg Oral BID  . ipratropium  0.5 mg Nebulization TID  . levalbuterol  0.63 mg Nebulization TID  . levofloxacin (LEVAQUIN) IV  750 mg Intravenous Q24H  . methylPREDNISolone (SOLU-MEDROL) injection  80 mg Intravenous Q6H  . pantoprazole  40 mg Oral Daily  . sodium chloride flush  3 mL Intravenous Q12H  . Vitamin D (Ergocalciferol)  50,000 Units Oral Once per day on Mon Thu   Continuous Infusions: . sodium chloride 100 mL/hr at 08/17/15 2142    Time spent: 35 minutes with > 50% of time discussing current diagnostic test results, clinical impression and plan of care.     [facility] Pager [phone]. If unable to reach me by pager, please call my cell phone at [phone].  *Please refer to [email or web address], password [email or web address] to get updated schedule on who will round on this patient, as hospitalists switch teams weekly. If 7PM-7AM, please contact night-coverage at [email or web address], password [email or web address] for any overnight needs.  08/18/2015, 7:27 AM

## 2015-08-19 DIAGNOSIS — R002 Palpitations: Secondary | ICD-10-CM | POA: Diagnosis not present

## 2015-08-19 DIAGNOSIS — F419 Anxiety disorder, unspecified: Secondary | ICD-10-CM | POA: Diagnosis not present

## 2015-08-19 DIAGNOSIS — J45901 Unspecified asthma with (acute) exacerbation: Secondary | ICD-10-CM | POA: Diagnosis not present

## 2015-08-19 DIAGNOSIS — J209 Acute bronchitis, unspecified: Secondary | ICD-10-CM | POA: Diagnosis not present

## 2015-08-19 LAB — CBC
HEMATOCRIT: 38.3 % (ref 36.0–46.0)
HEMOGLOBIN: 12.4 g/dL (ref 12.0–15.0)
MCH: 27.2 pg (ref 26.0–34.0)
MCHC: 32.4 g/dL (ref 30.0–36.0)
MCV: 84 fL (ref 78.0–100.0)
Platelets: 237 10*3/uL (ref 150–400)
RBC: 4.56 MIL/uL (ref 3.87–5.11)
RDW: 13 % (ref 11.5–15.5)
WBC: 21.3 10*3/uL — AB (ref 4.0–10.5)

## 2015-08-19 LAB — BASIC METABOLIC PANEL
ANION GAP: 8 (ref 5–15)
BUN: 13 mg/dL (ref 6–20)
CALCIUM: 8.3 mg/dL — AB (ref 8.9–10.3)
CO2: 20 mmol/L — AB (ref 22–32)
Chloride: 114 mmol/L — ABNORMAL HIGH (ref 101–111)
Creatinine, Ser: 0.7 mg/dL (ref 0.44–1.00)
GFR calc non Af Amer: >60 mL/min (ref >60)
Glucose, Bld: 156 mg/dL — ABNORMAL HIGH (ref 65–99)
POTASSIUM: 4.5 mmol/L (ref 3.5–5.1)
Sodium: 142 mmol/L (ref 135–145)

## 2015-08-19 MED ORDER — ALBUTEROL SULFATE HFA 108 (90 BASE) MCG/ACT IN AERS: 2.0000 | INHALATION_SPRAY | Freq: Four times a day (QID) | RESPIRATORY_TRACT | Status: DC | PRN

## 2015-08-19 MED ORDER — OSELTAMIVIR PHOSPHATE 75 MG PO CAPS: 75.0000 mg | ORAL_CAPSULE | Freq: Two times a day (BID) | ORAL | Status: DC

## 2015-08-19 MED ORDER — GUAIFENESIN ER 600 MG PO TB12: 600.0000 mg | ORAL_TABLET | Freq: Two times a day (BID) | ORAL | Status: DC

## 2015-08-19 MED ORDER — PREDNISONE 10 MG (21) PO TBPK: ORAL_TABLET | ORAL | Status: DC

## 2015-08-19 MED ORDER — BENZONATATE 100 MG PO CAPS: 100.0000 mg | ORAL_CAPSULE | ORAL | Status: DC | PRN

## 2015-08-19 NOTE — Care Management Note (Signed)
Case Management Note  Patient Details  Name: Sonya Page MRN: [id number] Date of Birth: 16-Oct-1980  Subjective/Objective:                    Action/Plan:d/c home no needs or orders.   Expected Discharge Date:                  Expected Discharge Plan:  Home/Self Care  In-House Referral:     Discharge planning Services  CM Consult  Post Acute Care Choice:    Choice offered to:     DME Arranged:    DME Agency:     HH Arranged:    [place] Agency:     Status of Service:  Completed, signed off  Medicare Important Message Given:    Date Medicare IM Given:    Medicare IM give by:    Date Additional Medicare IM Given:    Additional Medicare Important Message give by:     If discussed at [place] of Stay Meetings, dates discussed:    Additional Comments:  Dessa Phi, RN 08/19/2015, 10:52 AM

## 2015-08-19 NOTE — Discharge Summary (Signed)
Physician Discharge Summary  Sonya Page [id number] DOB: 17-Oct-1980 DOA: 08/17/2015  PCP: Eldridge Abrahams, NP  Admit date: 08/17/2015 Discharge date: 08/19/2015   Recommendations for Outpatient Follow-Up:   1. Recommend consideration for outpatient PFTs.   Discharge Diagnosis:   Principal Problem:    Asthma exacerbation secondary to acute bronchitis and influenza A Active Problems:    Anxiety    Heart palpitations    Acute bronchitis    Tobacco abuse disorder    Influenza A   Discharge disposition:  Home.    Discharge Condition: Improved.  Diet recommendation: Regular.   History of Present Illness:   Sonya Page is an 35 y.o. female the PMH of asthma, pseudotumor cerebri, anxiety, hyperlipidemia, and morbid obesity who was admitted 08/17/15 with URI symptoms and an asthma exacerbation after her son became sick with influenza.  Hospital Course by Problem:   Principal Problem:  Asthma exacerbation/acute bronchitis secondary to influenza A - Clinical practice guidelines recommend against empiric antibiotic therapy for the treatment of an asthma exacerbation, because most respiratory infections that trigger an exacerbation of asthma are viral rather than bacteria & in the face of a normal CT of the chest, empiric antibiotics which were started on admission were discontinued.  - Tamiflu started given influenza A+ PCR. - Rapid strep screen negative. - Asthma exacerbation treated with IV magnesium, bronchodilators, and IV Solu-Medrol as well as antitussives/Mucinex. - Discharge home on Tamiflu, prednisone taper, Tessalon Perles, albuterol inhaler, and Mucinex.  Active Problems:  Steroid-induced hyperglycemia - Mild. Should improve with weaning of steroids.   Anxiety - Continue clonazepam.   Heart palpitations - EKG without arrhythmia. Palpitations are likely related to adrenergic effects of bronchodilators. - 2-D echocardiogram unremarkable.    Tobacco abuse disorder - Counseling cessation requested per nursing.    Medical Consultants:    None.   Discharge Exam:   Filed Vitals:   08/18/15 2236 08/19/15 0615  BP: 108/74 98/63  Pulse: 104 105  Temp: 98.1 F (36.7 C) 98.6 F (37 C)  Resp: 18 18   Filed Vitals:   08/18/15 1326 08/18/15 2236 08/19/15 0615 08/19/15 0956  BP:  108/74 98/63   Pulse:  104 105   Temp:  98.1 F (36.7 C) 98.6 F (37 C)   TempSrc:  Oral Oral   Resp:  18 18   Height:      Weight:      SpO2: 98% 96% 96% 94%    Gen:  NAD Cardiovascular:  RRR, No M/R/G Respiratory: Lungs with decreased air movement and expiratory wheezes on forced expiration Gastrointestinal: Abdomen soft, NT/ND with normal active bowel sounds. Extremities: No C/E/C   The results of significant diagnostics from this hospitalization (including imaging, microbiology, ancillary and laboratory) are listed below for reference.     Procedures and Diagnostic Studies:   Ct Angio Chest Pe W/cm &/or Wo Cm  08/17/2015  CLINICAL DATA:  Shortness of breath for several days, initial encounter EXAM: CT ANGIOGRAPHY CHEST WITH CONTRAST TECHNIQUE: Multidetector CT imaging of the chest was performed using the standard protocol during bolus administration of intravenous contrast. Multiplanar CT image reconstructions and MIPs were obtained to evaluate the vascular anatomy. CONTRAST:  132mL OMNIPAQUE IOHEXOL 350 MG/ML SOLN COMPARISON:  None. FINDINGS: Lungs are well aerated bilaterally.  No focal infiltrate is seen. The thoracic inlet is within normal limits. The thoracic aorta and pulmonary artery are well visualized. No filling defects to suggest pulmonary emboli are seen. No hilar or mediastinal  adenopathy is noted. The visualized upper abdomen reveals no acute abnormality. No bony abnormality is seen. Review of the MIP images confirms the above findings. IMPRESSION: No acute abnormality noted. Electronically Signed   By: Inez Catalina M.D.    On: 08/17/2015 16:41   Dg Chest Portable 1 View  08/17/2015  CLINICAL DATA:  35 year old presenting with 1 day history of cough, shortness of breath and sore throat. Current smoker with asthma. EXAM: PORTABLE CHEST 1 VIEW COMPARISON:  08/16/2015 and earlier. FINDINGS: Suboptimal inspiration due to body habitus accounts for crowded bronchovascular markings, especially in the bases, and accentuates the cardiac silhouette. Taking this into account, cardiomediastinal silhouette unremarkable. Lungs clear. Bronchovascular markings normal. Pulmonary vascularity normal. No visible pleural effusions. No pneumothorax. IMPRESSION: Suboptimal inspiration. No acute cardiopulmonary disease. No change since yesterday. Electronically Signed   By: Evangeline Dakin M.D.   On: 08/17/2015 10:30   2-D echocardiogram  Study Conclusions  - Left ventricle: The cavity size was normal. Systolic function was vigorous. The estimated ejection fraction was in the range of 65% to 70%. Wall motion was normal; there were no regional wall motion abnormalities. Due to tachycardia, there was fusion of early and atrial contributions to ventricular filling. The study is not technically sufficient to allow evaluation of LV diastolic function.  Labs:   Basic Metabolic Panel:  Recent Labs Lab 08/17/15 1006 08/17/15 2105 08/18/15 0510 08/19/15 0438  NA 139  --  138 142  K 5.1  --  3.8 4.5  CL 108  --  108 114*  CO2 14*  --  17* 20*  GLUCOSE 113*  --  161* 156*  BUN 11  --  14 13  CREATININE 1.24*  --  1.01* 0.70  CALCIUM 9.1  --  8.6* 8.3*  MG  --  2.0  --   --   PHOS  --  2.9  --   --    GFR Estimated Creatinine Clearance: 122.7 mL/min (by C-G formula based on Cr of 0.7). Liver Function Tests:  Recent Labs Lab 08/17/15 1006 08/18/15 0510  AST 36 34  ALT 31 24  ALKPHOS 68 64  BILITOT 1.3* 0.6  PROT 8.1 7.7  ALBUMIN 4.2 3.7   CBC:  Recent Labs Lab 08/17/15 1006 08/18/15 0510 08/19/15 0438   WBC 12.4* 14.5* 21.3*  NEUTROABS 10.9* 13.5*  --   HGB 14.5 14.0 12.4  HCT 44.3 41.7 38.3  MCV 84.4 83.9 84.0  PLT 252 231 237   Cardiac Enzymes:  Recent Labs Lab 08/17/15 1006  TROPONINI <0.03   D-Dimer  Recent Labs  08/17/15 1027  DDIMER 1.61*   Thyroid function studies  Recent Labs  08/17/15 2105  TSH 1.078    Microbiology Recent Results (from the past 240 hour(s))  Blood culture (routine x 2)     Status: None (Preliminary result)   Collection Time: 08/17/15  9:57 AM  Result Value Ref Range Status   Specimen Description BLOOD LEFT ANTECUBITAL  Final   Special Requests BOTTLES DRAWN AEROBIC AND ANAEROBIC 5CC  Final   Culture   Final    NO GROWTH 2 DAYS Performed at [facility]    Report Status PENDING  Incomplete  Blood culture (routine x 2)     Status: None (Preliminary result)   Collection Time: 08/17/15  9:58 AM  Result Value Ref Range Status   Specimen Description BLOOD BLOOD LEFT FOREARM  Final   Special Requests BOTTLES DRAWN AEROBIC AND ANAEROBIC 5CC  Final   Culture   Final    NO GROWTH 2 DAYS Performed at [facility]    Report Status PENDING  Incomplete  Rapid strep screen (not at [facility])     Status: None   Collection Time: 08/17/15 11:29 PM  Result Value Ref Range Status   Streptococcus, Group A Screen (Direct) NEGATIVE NEGATIVE Final    Comment: (NOTE) A Rapid Antigen test may result negative if the antigen level in the sample is below the detection level of this test. The FDA has not cleared this test as a stand-alone test therefore the rapid antigen negative result has reflexed to a Group A Strep culture.   Culture, group A strep     Status: None (Preliminary result)   Collection Time: 08/17/15 11:29 PM  Result Value Ref Range Status   Specimen Description THROAT  Final   Special Requests NONE  Final   Culture   Final    CULTURE REINCUBATED FOR BETTER GROWTH Performed at [facility]    Report Status PENDING   Incomplete     Discharge Instructions:   Discharge Instructions    Call MD for:  extreme fatigue    Complete by:  As directed      Call MD for:  temperature >100.4    Complete by:  As directed      Diet general    Complete by:  As directed      Increase activity slowly    Complete by:  As directed             Medication List    STOP taking these medications        azithromycin 250 MG tablet  Commonly known as:  ZITHROMAX      TAKE these medications        albuterol 108 (90 Base) MCG/ACT inhaler  Commonly known as:  PROVENTIL HFA;VENTOLIN HFA  Inhale 2 puffs into the lungs every 6 (six) hours as needed for wheezing or shortness of breath.     benzonatate 100 MG capsule  Commonly known as:  TESSALON  Take 1 capsule (100 mg total) by mouth every 4 (four) hours as needed for cough.     clonazePAM 0.5 MG tablet  Commonly known as:  KLONOPIN  Take 0.5 mg by mouth 2 (two) times daily as needed for anxiety.     guaiFENesin 600 MG 12 hr tablet  Commonly known as:  MUCINEX  Take 1 tablet (600 mg total) by mouth 2 (two) times daily.     MedroxyPROGESTERone Acetate 150 MG/ML [place]  Inject 150 mg into the muscle every 3 (three) months.     NOREL AD 4-10-325 MG Tabs  Generic drug:  Chlorphen-PE-Acetaminophen  Take 1 tablet by mouth 3 (three) times daily as needed (cold symptoms).     oseltamivir 75 MG capsule  Commonly known as:  TAMIFLU  Take 1 capsule (75 mg total) by mouth 2 (two) times daily.     predniSONE 10 MG (21) Tbpk tablet  Commonly known as:  STERAPRED UNI-PAK 21 TAB  Take as directed     Vitamin D (Ergocalciferol) 50000 units Caps capsule  Commonly known as:  DRISDOL  Take 50,000 Units by mouth 2 (two) times a week.           Follow-up Information    Schedule an appointment as soon as possible for a visit with Clover Mealy, NP.   Why:  As needed, If symptoms worsen  Time coordinating discharge: 25  minutes.  Signed:  RAMA,CHRISTINA  Pager [phone] [facility] 08/19/2015, 4:15 PM

## 2015-08-19 NOTE — Discharge Summary (Deleted)
Physician Discharge Summary  Sonya Page [id number] DOB: [birth year] DOA: 08/17/2015  PCP: No PCP Per Patient  Admit date: 08/17/2015 Discharge date: 08/19/2015   Recommendations for Outpatient Follow-Up:   1. F/U with PCP for non-resolution of symptoms. F/U final blood culture results. 2. Consider outpatient PFTs.   Discharge Diagnosis:   Principal Problem:    Asthma exacerbation secondary to acute bronchitis from influenza A Active Problems:    Anxiety    Heart palpitations    Acute bronchitis    Tobacco abuse disorder    Influenza A   Discharge disposition:  Home.    Discharge Condition: Improved.  Diet recommendation: Regular.   History of Present Illness:   Sonya Page is an 35 y.o. female the PMH of asthma, pseudotumor cerebri, anxiety, hyperlipidemia, and morbid obesity who was admitted 08/17/15 with URI symptoms and an asthma exacerbation after her son became sick with influenza.  Hospital Course by Problem:   Principal Problem:  Asthma exacerbation/acute bronchitis secondary to influenza A - Clinical practice guidelines recommend against empiric antibiotic therapy for the treatment of an asthma exacerbation, because most respiratory infections that trigger an exacerbation of asthma are viral rather than bacteria & in the face of a normal CT of the chest, empiric Levaquin was discontinued 08/18/15. Continue Tamiflu. Influenza A+. - Rapid strep screen negative. - Given 2 g of magnesium in the ED. - Continue Mucinex, antitussives, bronchodilators, and D/C home on a prednisone taper.  Active Problems:  Steroid-induced hyperglycemia - Mild. Should improve with weaning of steroids.   Anxiety - Continue clonazepam.   Heart palpitations - EKG without arrhythmia. Palpitations are likely related to adrenergic effects of bronchodilators. - 2-D echo done 08/18/15, EF 65-70 percent.   Tobacco abuse disorder - Counseling cessation requested per  nursing.    Medical Consultants:    None.   Discharge Exam:   Filed Vitals:   08/18/15 2236 08/19/15 0615  BP: 108/74 98/63  Pulse: 104 105  Temp: 98.1 F (36.7 C) 98.6 F (37 C)  Resp: 18 18   Filed Vitals:   08/18/15 1326 08/18/15 2236 08/19/15 0615 08/19/15 0956  BP:  108/74 98/63   Pulse:  104 105   Temp:  98.1 F (36.7 C) 98.6 F (37 C)   TempSrc:  Oral Oral   Resp:  18 18   Height:      Weight:      SpO2: 98% 96% 96% 94%    Gen:  NAD Cardiovascular:  RRR, No M/R/G Respiratory: Lungs diminished, course with forced expiration, but no wheeze with normal respiration Gastrointestinal: Abdomen soft, NT/ND with normal active bowel sounds. Extremities: No C/E/C   The results of significant diagnostics from this hospitalization (including imaging, microbiology, ancillary and laboratory) are listed below for reference.     Procedures and Diagnostic Studies:   Ct Angio Chest Pe W/cm &/or Wo Cm  08/17/2015  CLINICAL DATA:  Shortness of breath for several days, initial encounter EXAM: CT ANGIOGRAPHY CHEST WITH CONTRAST TECHNIQUE: Multidetector CT imaging of the chest was performed using the standard protocol during bolus administration of intravenous contrast. Multiplanar CT image reconstructions and MIPs were obtained to evaluate the vascular anatomy. CONTRAST:  188mL OMNIPAQUE IOHEXOL 350 MG/ML SOLN COMPARISON:  None. FINDINGS: Lungs are well aerated bilaterally.  No focal infiltrate is seen. The thoracic inlet is within normal limits. The thoracic aorta and pulmonary artery are well visualized. No filling defects to suggest pulmonary emboli are seen. No hilar  or mediastinal adenopathy is noted. The visualized upper abdomen reveals no acute abnormality. No bony abnormality is seen. Review of the MIP images confirms the above findings. IMPRESSION: No acute abnormality noted. Electronically Signed   By: Inez Catalina M.D.   On: 08/17/2015 16:41   Dg Chest Portable 1  View  08/17/2015  CLINICAL DATA:  35 year old presenting with 1 day history of cough, shortness of breath and sore throat. Current smoker with asthma. EXAM: PORTABLE CHEST 1 VIEW COMPARISON:  08/16/2015 and earlier. FINDINGS: Suboptimal inspiration due to body habitus accounts for crowded bronchovascular markings, especially in the bases, and accentuates the cardiac silhouette. Taking this into account, cardiomediastinal silhouette unremarkable. Lungs clear. Bronchovascular markings normal. Pulmonary vascularity normal. No visible pleural effusions. No pneumothorax. IMPRESSION: Suboptimal inspiration. No acute cardiopulmonary disease. No change since yesterday. Electronically Signed   By: Evangeline Dakin M.D.   On: 08/17/2015 10:30   2 D Echo 08/18/15  Study Conclusions  - Left ventricle: The cavity size was normal. Systolic function was vigorous. The estimated ejection fraction was in the range of 65% to 70%. Wall motion was normal; there were no regional wall motion abnormalities. Due to tachycardia, there was fusion of early and atrial contributions to ventricular filling. The study is not technically sufficient to allow evaluation of LV diastolic function.  Labs:   Basic Metabolic Panel:  Recent Labs Lab 08/17/15 1006 08/17/15 2105 08/18/15 0510 08/19/15 0438  NA 139  --  138 142  K 5.1  --  3.8 4.5  CL 108  --  108 114*  CO2 14*  --  17* 20*  GLUCOSE 113*  --  161* 156*  BUN 11  --  14 13  CREATININE 1.24*  --  1.01* 0.70  CALCIUM 9.1  --  8.6* 8.3*  MG  --  2.0  --   --   PHOS  --  2.9  --   --    GFR Estimated Creatinine Clearance: 122.7 mL/min (by C-G formula based on Cr of 0.7). Liver Function Tests:  Recent Labs Lab 08/17/15 1006 08/18/15 0510  AST 36 34  ALT 31 24  ALKPHOS 68 64  BILITOT 1.3* 0.6  PROT 8.1 7.7  ALBUMIN 4.2 3.7   CBC:  Recent Labs Lab 08/17/15 1006 08/18/15 0510 08/19/15 0438  WBC 12.4* 14.5* 21.3*  NEUTROABS 10.9*  13.5*  --   HGB 14.5 14.0 12.4  HCT 44.3 41.7 38.3  MCV 84.4 83.9 84.0  PLT 252 231 237   Cardiac Enzymes:  Recent Labs Lab 08/17/15 1006  TROPONINI <0.03   D-Dimer  Recent Labs  08/17/15 1027  DDIMER 1.61*   Thyroid function studies  Recent Labs  08/17/15 2105  TSH 1.078   Microbiology Recent Results (from the past 240 hour(s))  Blood culture (routine x 2)     Status: None (Preliminary result)   Collection Time: 08/17/15  9:57 AM  Result Value Ref Range Status   Specimen Description BLOOD LEFT ANTECUBITAL  Final   Special Requests BOTTLES DRAWN AEROBIC AND ANAEROBIC 5CC  Final   Culture   Final    NO GROWTH 1 DAY Performed at [facility]    Report Status PENDING  Incomplete  Blood culture (routine x 2)     Status: None (Preliminary result)   Collection Time: 08/17/15  9:58 AM  Result Value Ref Range Status   Specimen Description BLOOD BLOOD LEFT FOREARM  Final   Special Requests BOTTLES DRAWN AEROBIC  AND ANAEROBIC 5CC  Final   Culture   Final    NO GROWTH 1 DAY Performed at [facility]    Report Status PENDING  Incomplete  Rapid strep screen (not at [facility])     Status: None   Collection Time: 08/17/15 11:29 PM  Result Value Ref Range Status   Streptococcus, Group A Screen (Direct) NEGATIVE NEGATIVE Final    Comment: (NOTE) A Rapid Antigen test may result negative if the antigen level in the sample is below the detection level of this test. The FDA has not cleared this test as a stand-alone test therefore the rapid antigen negative result has reflexed to a Group A Strep culture.      Discharge Instructions:   Discharge Instructions    Call MD for:  extreme fatigue    Complete by:  As directed      Call MD for:  temperature >100.4    Complete by:  As directed      Diet general    Complete by:  As directed      Increase activity slowly    Complete by:  As directed             Medication List    STOP taking these medications         azithromycin 250 MG tablet  Commonly known as:  ZITHROMAX      TAKE these medications        albuterol 108 (90 Base) MCG/ACT inhaler  Commonly known as:  PROVENTIL HFA;VENTOLIN HFA  Inhale 2 puffs into the lungs every 6 (six) hours as needed for wheezing or shortness of breath.     benzonatate 100 MG capsule  Commonly known as:  TESSALON  Take 1 capsule (100 mg total) by mouth every 4 (four) hours as needed for cough.     clonazePAM 0.5 MG tablet  Commonly known as:  KLONOPIN  Take 0.5 mg by mouth 2 (two) times daily as needed for anxiety.     guaiFENesin 600 MG 12 hr tablet  Commonly known as:  MUCINEX  Take 1 tablet (600 mg total) by mouth 2 (two) times daily.     MedroxyPROGESTERone Acetate 150 MG/ML Susy  Inject 150 mg into the muscle every 3 (three) months.     NOREL AD 4-10-325 MG Tabs  Generic drug:  Chlorphen-PE-Acetaminophen  Take 1 tablet by mouth 3 (three) times daily as needed (cold symptoms).     oseltamivir 75 MG capsule  Commonly known as:  TAMIFLU  Take 1 capsule (75 mg total) by mouth 2 (two) times daily.     predniSONE 10 MG (21) Tbpk tablet  Commonly known as:  STERAPRED UNI-PAK 21 TAB  Take as directed     Vitamin D (Ergocalciferol) 50000 units Caps capsule  Commonly known as:  DRISDOL  Take 50,000 Units by mouth 2 (two) times a week.           Follow-up Information    Schedule an appointment as soon as possible for a visit with Clover Mealy, NP.   Why:  As needed, If symptoms worsen       Time coordinating discharge: 25 minutes.  Signed:  RAMA,CHRISTINA  Pager [phone] [facility] 08/19/2015, 10:16 AM       08/19/2015    Re: Alcide Clever    The above named individual was in the hospital from 08/17/2015-08/19/2015 under my care.  She will be medically clear to return to work on  08/24/15.     Jacquelynn Cree, MD      [facility]

## 2015-08-20 LAB — CULTURE, GROUP A STREP (THRC)

## 2015-08-22 LAB — CULTURE, BLOOD (ROUTINE X 2)
CULTURE: NO GROWTH
CULTURE: NO GROWTH

## 2016-03-17 ENCOUNTER — Ambulatory Visit: Payer: BC Managed Care – PPO | Admitting: Neurology

## 2016-04-09 ENCOUNTER — Ambulatory Visit (INDEPENDENT_AMBULATORY_CARE_PROVIDER_SITE_OTHER): Payer: BC Managed Care – PPO | Admitting: Neurology

## 2016-04-09 ENCOUNTER — Encounter: Payer: Self-pay | Admitting: Neurology

## 2016-04-09 VITALS — BP 112/68 | HR 101 | Ht 63.0 in | Wt 256.0 lb

## 2016-04-09 DIAGNOSIS — G932 Benign intracranial hypertension: Secondary | ICD-10-CM | POA: Diagnosis not present

## 2016-04-09 DIAGNOSIS — H539 Unspecified visual disturbance: Secondary | ICD-10-CM

## 2016-04-09 NOTE — Progress Notes (Signed)
NEUROLOGY CONSULTATION NOTE  Sonya Page MRN: [id number] DOB: 08-18-1980  Referring provider: Eldridge Abrahams, FNP Primary care provider: No PCP  Reason for consult:  Pseudotumor cerebri  HISTORY OF PRESENT ILLNESS: Sonya Page is a 35 year old woman with asthma and history of pseudotumor cerebri who presents for visual disturbance.  She was diagnosed with idiopathic intracranial hypertension in 2013.  At that time, she experienced severe constant headache.  CT of head performed on 09/19/11 for headache evaluation was personally reviewed and was unremarkable.  She followed up with a neurologist, who performed a lumbar puncture and was found to have an opening pressure of 36.  She was subsequently started on acetazolamide.  A couple of weeks later, she developed worsening headache and stiff neck.  She presented to [facility] where she was found to have a temperature of 100.7 and CBC demonstrated a WBC of 11.  She underwent an LP, which demonstrated CSF cell count of I50 with Polys 3 and Mono 97.  Glucose was 58 and protein was 66.  Gram stain, culture, CMV, EBV, HSV and VZV were negative.  She was diagnosed with aseptic meningitis.  She remained on acetazolamide for a little while and got better, so she discontinued it.  Over the past few weeks, she reports a subtle problem with her left eye.  She denies blurred vision or double vision.  She reports that it doesn't seem like her left eye is moving with her right eye.  She reports her vision seems a little hazy in the left eye.  She denies headache or fever.  PAST MEDICAL HISTORY: Past Medical History:  Diagnosis Date  . Anxiety state, unspecified   . Asthma   . Diarrhea   . Leg pain   . 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 Palpitations 11/2013   moderate per pt.  . Premature beats, unspecified    Isolated PVC on 12/28/13 EKG, probable cause of fluttering  . Pseudotumor cerebri   . Pure hypercholesterolemia   . PVC  (premature ventricular contraction)     PAST SURGICAL HISTORY: Past Surgical History:  Procedure Laterality Date  . CESAREAN SECTION  2010    MEDICATIONS: Current Outpatient Prescriptions on File Prior to Visit  Medication Sig Dispense Refill  . albuterol (PROVENTIL HFA;VENTOLIN HFA) 108 (90 Base) MCG/ACT inhaler Inhale 2 puffs into the lungs every 6 (six) hours as needed for wheezing or shortness of breath. 1 Inhaler 2  . clonazePAM (KLONOPIN) 0.5 MG tablet Take 0.5 mg by mouth 2 (two) times daily as needed for anxiety.    . MedroxyPROGESTERone Acetate 150 MG/ML SUSY Inject 150 mg into the muscle every 3 (three) months.    . Vitamin D, Ergocalciferol, (DRISDOL) 50000 units CAPS capsule Take 50,000 Units by mouth 2 (two) times a week.      No current facility-administered medications on file prior to visit.     ALLERGIES: Allergies  Allergen Reactions  . Penicillins     Has patient had a PCN reaction causing immediate rash, facial/tongue/throat swelling, SOB or lightheadedness with hypotension: NO (childhood RXN) Has patient had a PCN reaction causing severe rash involving mucus membranes or skin necrosis: NO Has patient had a PCN reaction that required hospitalization NO Has patient had a PCN reaction occurring within the last 10 years: NO If all of the above answers are "NO", then may proceed with Cephalosporin use.     FAMILY HISTORY: No family history on file.  SOCIAL  HISTORY: Social History   Social History  . Marital status: Married    Spouse name: N/A  . Number of children: N/A  . Years of education: N/A   Occupational History  . Not on file.   Social History Main Topics  . Smoking status: Current Every Day Smoker    Packs/day: 0.50    Types: Cigarettes  . Smokeless tobacco: Not on file  . Alcohol use Yes     Comment: holidays  . Drug use: No  . Sexual activity: Yes    Birth control/ protection: Injection   Other Topics Concern  . Not on file    Social History Narrative  . No narrative on file    REVIEW OF SYSTEMS: Constitutional: No fevers, chills, or sweats, no generalized fatigue, change in appetite Eyes: No visual changes, double vision, eye pain Ear, nose and throat: No hearing loss, ear pain, nasal congestion, sore throat Cardiovascular: No chest pain, palpitations Respiratory:  No shortness of breath at rest or with exertion, wheezes GastrointestinaI: No nausea, vomiting, diarrhea, abdominal pain, fecal incontinence Genitourinary:  No dysuria, urinary retention or frequency Musculoskeletal:  No neck pain, back pain Integumentary: No rash, pruritus, skin lesions Neurological: as above Psychiatric: No depression, insomnia, anxiety Endocrine: No palpitations, fatigue, diaphoresis, mood swings, change in appetite, change in weight, increased thirst Hematologic/Lymphatic:  No purpura, petechiae. Allergic/Immunologic: no itchy/runny eyes, nasal congestion, recent allergic reactions, rashes  PHYSICAL EXAM: Vitals:   04/09/16 1303  BP: 112/68  Pulse: (!) 101   General: No acute distress.  Patient appears well-groomed.   Head:  Normocephalic/atraumatic Eyes:  fundi examined but not visualized Neck: supple, no paraspinal tenderness, full range of motion Back: No paraspinal tenderness Heart: regular rate and rhythm Lungs: Clear to auscultation bilaterally. Vascular: No carotid bruits. Neurological Exam: Mental status: alert and oriented to person, place, and time, recent and remote memory intact, fund of knowledge intact, attention and concentration intact, speech fluent and not dysarthric, language intact. Cranial nerves: CN I: not tested CN II: pupils equal, round and reactive to light, visual fields intact CN III, IV, VI:  full range of motion, no nystagmus, no ptosis CN V: facial sensation intact CN VII: upper and lower face symmetric CN VIII: hearing intact CN IX, X: gag intact, uvula midline CN XI:  sternocleidomastoid and trapezius muscles intact CN XII: tongue midline Bulk & Tone: normal, no fasciculations. Motor:  5/5 throughout  Sensation: temperature and vibration sensation intact. Deep Tendon Reflexes:  2+ throughout, toes downgoing.  Finger to nose testing:  Without dysmetria.  Heel to shin:  Without dysmetria.  Gait:  Normal station and stride.  Able to turn and tandem walk. Romberg negative.  IMPRESSION: Visual disturbance in left eye, no headache.  Questionable visual obscuration.  May be symptom of idiopathic intracranial hypertension. Morbid obesity Tobacco use  PLAN: 1.  We will refer to ophthalmology for more formal evaluation.  Further recommendations pending results.  If she does exhibit papilledema, may just treat with acetazolamide without further testing since she has a definitive past diagnosis. 2.  Discussed importance of weight loss as method to resolve IIH. 3.  Smoking cessation 4.  Follow up  Thank you for allowing me to take part in the care of this patient.  Metta Clines, DO  CC:  Eldridge Abrahams, FNP

## 2016-04-09 NOTE — Progress Notes (Signed)
Referral placed to Dr. Kathlen Mody @ [facility]

## 2016-04-09 NOTE — Patient Instructions (Signed)
1.  We will refer you to ophthalmology for eye exam 2.  If there is evidence of swelling, likely we will just start the diuretic, but I will let you know at the time 3.  Work on weight loss, which can help resolve pseudotumor cerebri 4.  Follow up

## 2016-04-09 NOTE — Progress Notes (Signed)
Chart forwarded.  

## 2017-06-18 ENCOUNTER — Emergency Department (HOSPITAL_COMMUNITY)
Admission: EM | Admit: 2017-06-18 | Discharge: 2017-06-18 | Disposition: A | Discharge status: Home / Self Care | Payer: BC Managed Care – PPO | Attending: Emergency Medicine | Admitting: Emergency Medicine

## 2017-06-18 ENCOUNTER — Other Ambulatory Visit: Payer: Self-pay

## 2017-06-18 ENCOUNTER — Encounter (HOSPITAL_COMMUNITY): Payer: Self-pay | Admitting: Emergency Medicine

## 2017-06-18 DIAGNOSIS — Z5321 Procedure and treatment not carried out due to patient leaving prior to being seen by health care provider: Secondary | ICD-10-CM | POA: Diagnosis not present

## 2017-06-18 DIAGNOSIS — R079 Chest pain, unspecified: Secondary | ICD-10-CM | POA: Insufficient documentation
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
## 2017-06-18 NOTE — ED Notes (Signed)
Called  No answer from lobby

## 2017-06-18 NOTE — ED Notes (Signed)
Bed: [facility] Expected date:  Expected time:  Means of arrival:  Comments: 

## 2017-06-18 NOTE — ED Notes (Signed)
Called  No response from lobby 

## 2017-09-02 ENCOUNTER — Ambulatory Visit: Payer: Self-pay | Admitting: Podiatry

## 2017-09-16 ENCOUNTER — Ambulatory Visit: Payer: Self-pay | Admitting: Podiatry

## 2017-10-02 IMAGING — CT CT ANGIO CHEST
2 of 6 series · 19 of 36 positions shown · IV contrast (OMNIPAQUE 350)
Comparison: None.

CLINICAL DATA: Shortness of breath for several days, initial
encounter

EXAM:
CT ANGIOGRAPHY CHEST WITH CONTRAST
TECHNIQUE: Multidetector CT imaging of the chest was performed using the
standard protocol during bolus administration of intravenous
contrast. Multiplanar CT image reconstructions and MIPs were
obtained to evaluate the vascular anatomy.
CONTRAST:  100mL OMNIPAQUE IOHEXOL 350 MG/ML SOLN

[Series 8: thins for pacs · axial · 0.78mm/px · z∈[-175,+12]mm · 18 of 209 slices shown]
[im 11/209  lung]
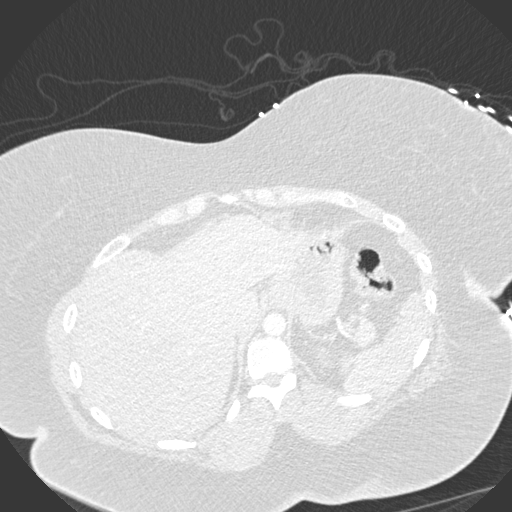
[im 21/209  mediastinal]
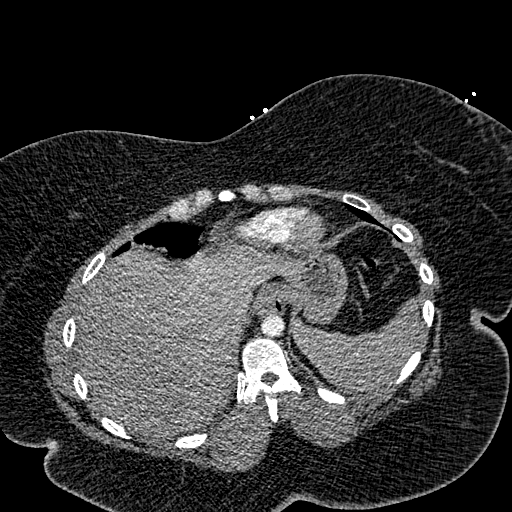
[im 32/209  lung]
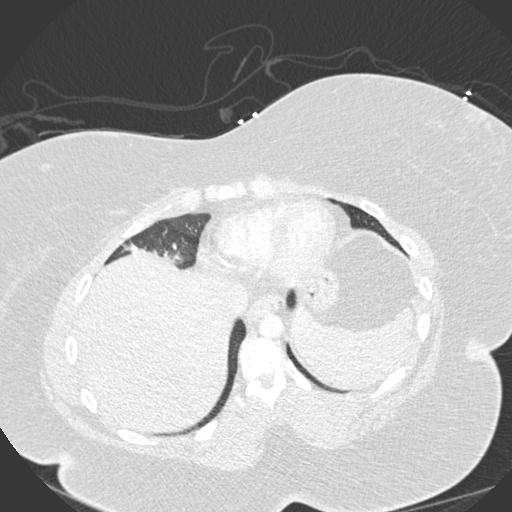
[im 42/209  mediastinal]
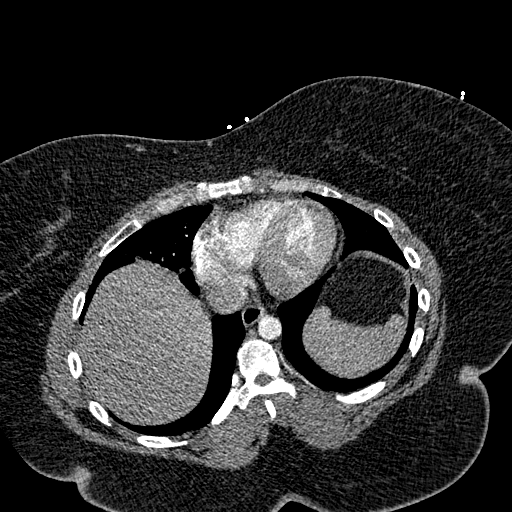
[im 53/209  lung]
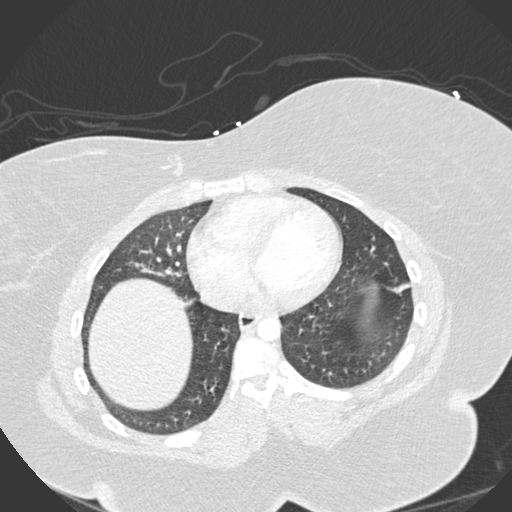
[im 63/209  mediastinal]
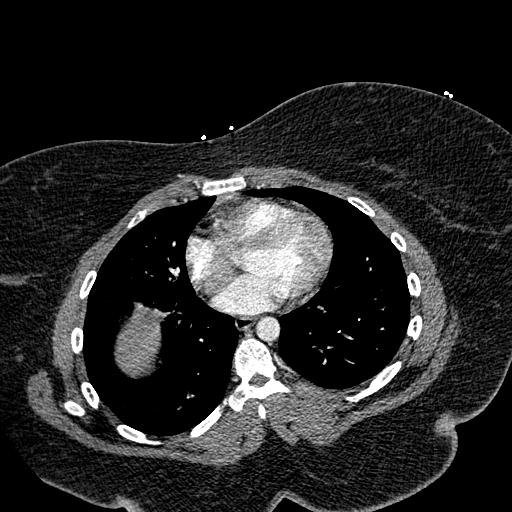
[im 73/209  lung]
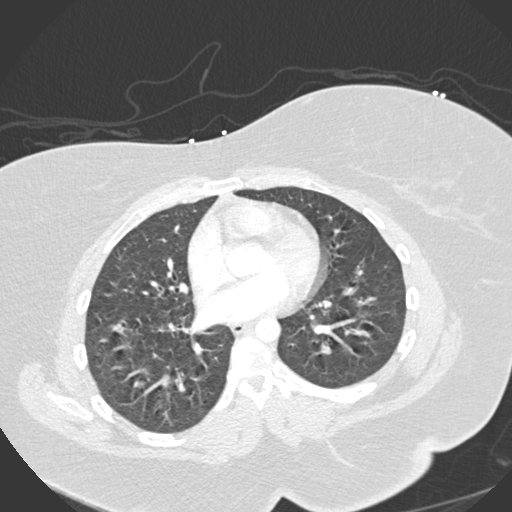
[im 84/209  mediastinal]
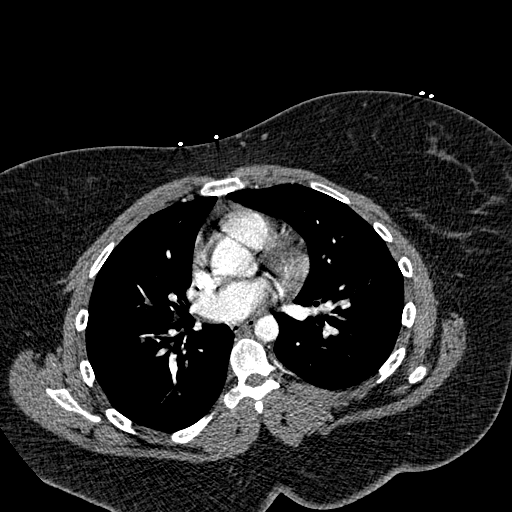
[im 94/209  lung]
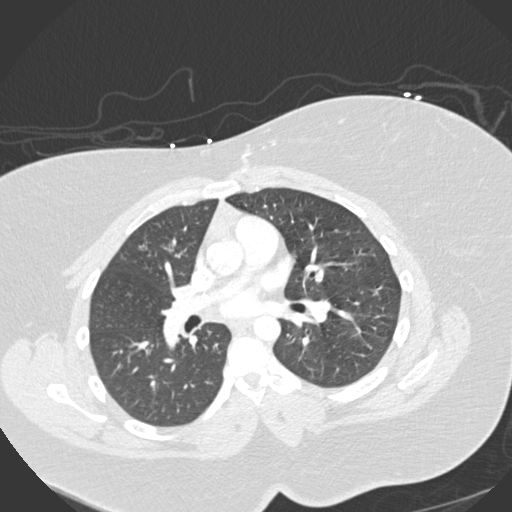
[im 115/209  mediastinal]
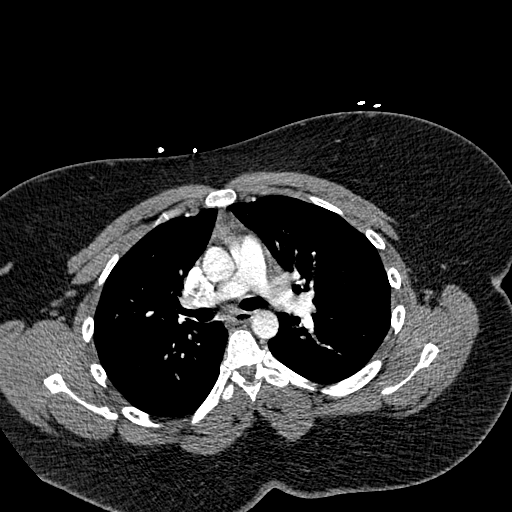
[im 125/209  lung]
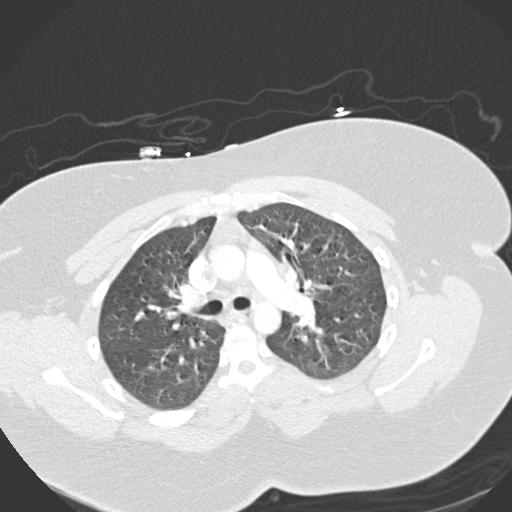
[im 136/209  mediastinal]
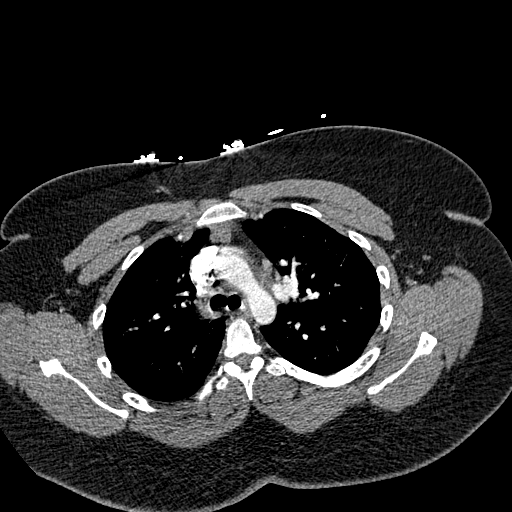
[im 146/209  lung]
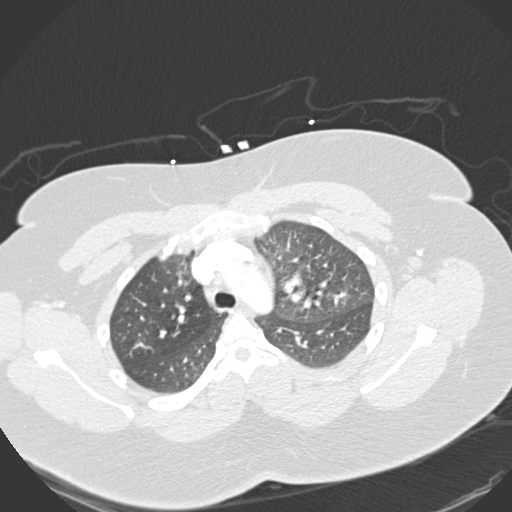
[im 157/209  mediastinal]
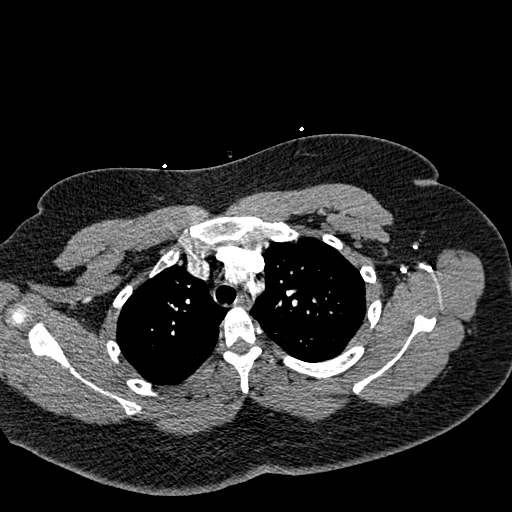
[im 167/209  lung]
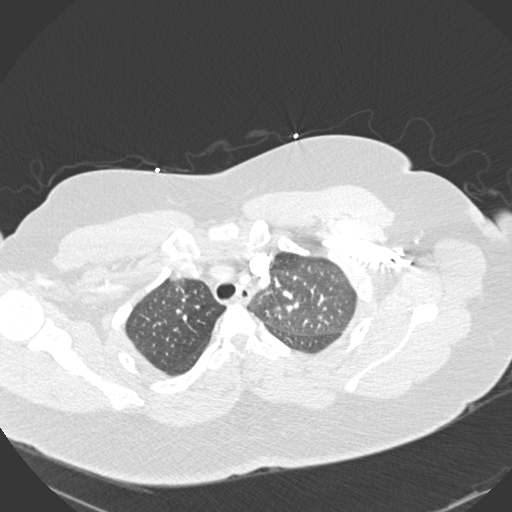
[im 177/209  mediastinal]
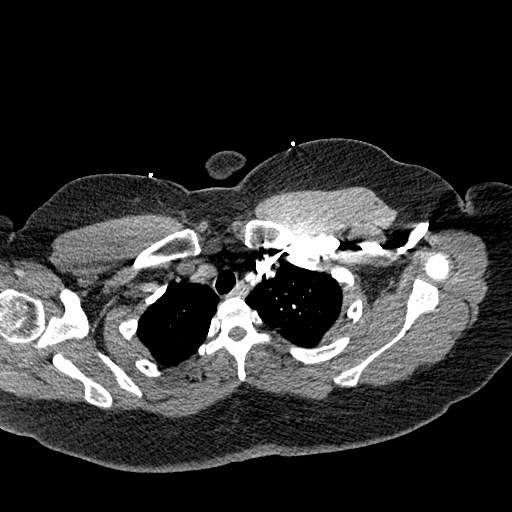
[im 188/209  lung]
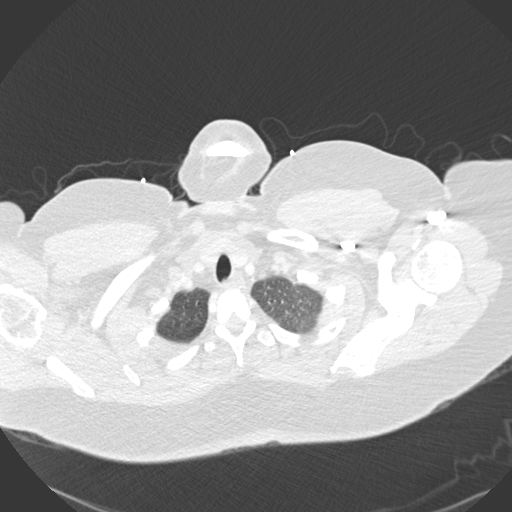
[im 198/209  mediastinal]
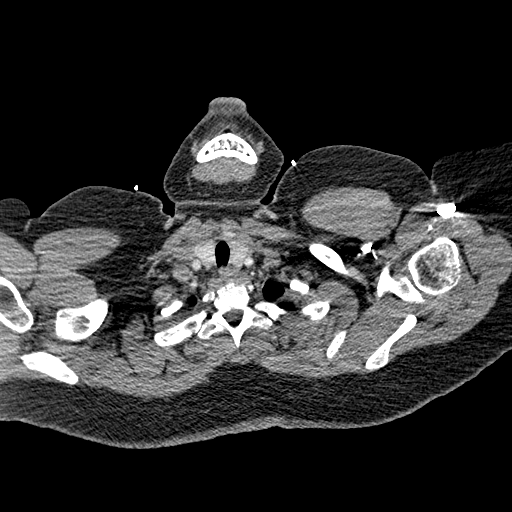

[Series 10: coronal mpr · coronal · 0.47mm/px · 1 of 165 slices shown]
[im 83/165  mediastinal]
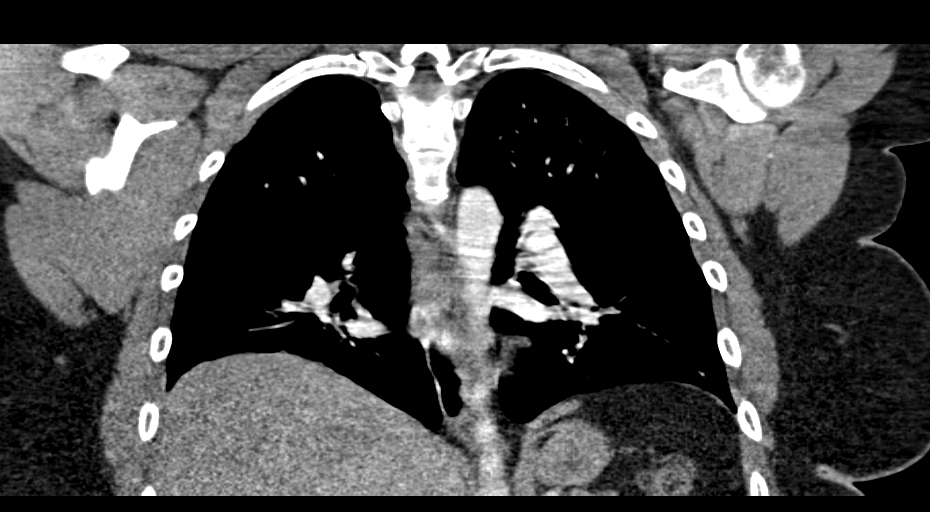

[19 of 36 positions shown; findings below may reference images not displayed]

FINDINGS: Lungs are well aerated bilaterally.  No focal infiltrate is seen.

The thoracic inlet is within normal limits. The thoracic aorta and
pulmonary artery are well visualized. No filling defects to suggest
pulmonary emboli are seen. No hilar or mediastinal adenopathy is
noted.

The visualized upper abdomen reveals no acute abnormality. No bony
abnormality is seen.

Review of the MIP images confirms the above findings.
IMPRESSION: No acute abnormality noted.

## 2017-10-14 ENCOUNTER — Ambulatory Visit: Payer: Self-pay | Admitting: Podiatry

## 2017-11-24 ENCOUNTER — Ambulatory Visit: Payer: BC Managed Care – PPO | Admitting: Neurology

## 2018-03-10 ENCOUNTER — Telehealth: Payer: Self-pay | Admitting: Podiatry

## 2018-03-10 ENCOUNTER — Encounter: Payer: BC Managed Care – PPO | Admitting: Podiatry

## 2018-03-10 NOTE — Telephone Encounter (Signed)
Please fax note of visit to [phone] Attn: Anderson Malta.

## 2018-03-12 NOTE — Progress Notes (Signed)
Sonya Page was scheduled for a new patient appointment. She arrived late to her scheduled appointment time. Due to this I had moved on to the next patient when she arrived. She apparently demanded to be seen right away or she was going to leave. As I could not get to her right away she left without being seen. We tried to tell her I would get to her as soon as possible and there was only one patient (who was already in the room and ready for me to see) in front of her and she did not want to wait.

## 2018-03-13 ENCOUNTER — Telehealth: Payer: Self-pay | Admitting: Podiatry

## 2018-04-12 NOTE — Progress Notes (Deleted)
NEUROLOGY FOLLOW UP OFFICE NOTE  DANIELL PARADISE [id number]  HISTORY OF PRESENT ILLNESS: Karlie Lonzo is a 37 year old woman with history of idiopathic intracranial hypertension who I previously saw for idiopathic intracranial hypertension in October 2017 who presents for difficulty focusing.  UPDATE: When I last saw patient in October 2017, I had referred her to ophthalmology for evaluation of papilledema and visual field loss.  ***  HISTORY: She was diagnosed with idiopathic intracranial hypertension in 2013.  At that time, she experienced severe constant headache.  CT of head performed on 09/19/11 for headache evaluation was personally reviewed and was unremarkable.  She followed up with a neurologist, who performed a lumbar puncture and was found to have an opening pressure of 36.  She was subsequently started on acetazolamide.  A couple of weeks later, she developed worsening headache and stiff neck.  She presented to [facility] where she was found to have a temperature of 100.7 and CBC demonstrated a WBC of 11.  She underwent an LP, which demonstrated CSF cell count of I50 with Polys 3 and Mono 97.  Glucose was 58 and protein was 66.  Gram stain, culture, CMV, EBV, HSV and VZV were negative.  She was diagnosed with aseptic meningitis.  She remained on acetazolamide for a little while and got better, so she discontinued it.  Over the past few weeks, she reports a subtle problem with her left eye.  She denies blurred vision or double vision.  She reports that it doesn't seem like her left eye is moving with her right eye.  She reports her vision seems a little hazy in the left eye.  She denies headache or fever.  PAST MEDICAL HISTORY: Past Medical History:  Diagnosis Date  . Anxiety state, unspecified   . Asthma   . Diarrhea   . Leg pain   . 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 Palpitations 11/2013   moderate per pt.  . Premature beats, unspecified    Isolated PVC on  12/28/13 EKG, probable cause of fluttering  . Pseudotumor cerebri   . Pure hypercholesterolemia   . PVC (premature ventricular contraction)     MEDICATIONS: Current Outpatient Medications on File Prior to Visit  Medication Sig Dispense Refill  . albuterol (PROVENTIL HFA;VENTOLIN HFA) 108 (90 Base) MCG/ACT inhaler Inhale 2 puffs into the lungs every 6 (six) hours as needed for wheezing or shortness of breath. 1 Inhaler 2  . clonazePAM (KLONOPIN) 0.5 MG tablet Take 0.5 mg by mouth 2 (two) times daily as needed for anxiety.    . MedroxyPROGESTERone Acetate 150 MG/ML SUSY Inject 150 mg into the muscle every 3 (three) months.    . Vitamin D, Ergocalciferol, (DRISDOL) 50000 units CAPS capsule Take 50,000 Units by mouth 2 (two) times a week.      No current facility-administered medications on file prior to visit.     ALLERGIES: Allergies  Allergen Reactions  . Penicillins     Has patient had a PCN reaction causing immediate rash, facial/tongue/throat swelling, SOB or lightheadedness with hypotension: NO (childhood RXN) Has patient had a PCN reaction causing severe rash involving mucus membranes or skin necrosis: NO Has patient had a PCN reaction that required hospitalization NO Has patient had a PCN reaction occurring within the last 10 years: NO If all of the above answers are "NO", then may proceed with Cephalosporin use.     FAMILY HISTORY: Family History  Problem Relation Age of Onset  .  Cancer Other   . Diabetes Other   . Hypertension Other    ***.  SOCIAL HISTORY: Social History   Socioeconomic History  . Marital status: Married    Spouse name: Not on file  . Number of children: Not on file  . Years of education: Not on file  . Highest education level: Not on file  Occupational History  . Not on file  Social Needs  . Financial resource strain: Not on file  . Food insecurity:    Worry: Not on file    Inability: Not on file  . Transportation needs:    Medical: Not  on file    Non-medical: Not on file  Tobacco Use  . Smoking status: Current Every Day Smoker    Packs/day: 0.50    Types: Cigarettes  . Smokeless tobacco: Never Used  Substance and Sexual Activity  . Alcohol use: Yes    Comment: occ  . Drug use: No  . Sexual activity: Yes    Birth control/protection: Injection  Lifestyle  . Physical activity:    Days per week: Not on file    Minutes per session: Not on file  . Stress: Not on file  Relationships  . Social connections:    Talks on phone: Not on file    Gets together: Not on file    Attends religious service: Not on file    Active member of club or organization: Not on file    Attends meetings of clubs or organizations: Not on file    Relationship status: Not on file  . Intimate partner violence:    Fear of current or ex partner: Not on file    Emotionally abused: Not on file    Physically abused: Not on file    Forced sexual activity: Not on file  Other Topics Concern  . Not on file  Social History Narrative  . Not on file    REVIEW OF SYSTEMS: Constitutional: No fevers, chills, or sweats, no generalized fatigue, change in appetite Eyes: No visual changes, double vision, eye pain Ear, nose and throat: No hearing loss, ear pain, nasal congestion, sore throat Cardiovascular: No chest pain, palpitations Respiratory:  No shortness of breath at rest or with exertion, wheezes GastrointestinaI: No nausea, vomiting, diarrhea, abdominal pain, fecal incontinence Genitourinary:  No dysuria, urinary retention or frequency Musculoskeletal:  No neck pain, back pain Integumentary: No rash, pruritus, skin lesions Neurological: as above Psychiatric: No depression, insomnia, anxiety Endocrine: No palpitations, fatigue, diaphoresis, mood swings, change in appetite, change in weight, increased thirst Hematologic/Lymphatic:  No purpura, petechiae. Allergic/Immunologic: no itchy/runny eyes, nasal congestion, recent allergic reactions,  rashes  PHYSICAL EXAM: *** General: No acute distress.  Patient appears ***-groomed.  *** body habitus. Head:  Normocephalic/atraumatic Eyes:  Fundi examined but not visualized Neck: supple, no paraspinal tenderness, full range of motion Heart:  Regular rate and rhythm Lungs:  Clear to auscultation bilaterally Back: No paraspinal tenderness Neurological Exam: alert and oriented to person, place, and time. Attention span and concentration intact, recent and remote memory intact, fund of knowledge intact.  Speech fluent and not dysarthric, language intact.  CN II-XII intact. Bulk and tone normal, muscle strength 5/5 throughout.  Sensation to light touch, temperature and vibration intact.  Deep tendon reflexes 2+ throughout, toes downgoing.  Finger to nose and heel to shin testing intact.  Gait normal, Romberg negative.  IMPRESSION: ***  PLAN: ***  Metta Clines, DO  CC: ***

## 2018-04-13 ENCOUNTER — Ambulatory Visit: Payer: BC Managed Care – PPO | Admitting: Neurology

## 2018-06-09 NOTE — Progress Notes (Deleted)
NEUROLOGY FOLLOW UP OFFICE NOTE  EUNIE Page [id number]  HISTORY OF PRESENT ILLNESS: Sonya Page is a 37 year old woman with asthma and history of idiopathic intracranial hypertension who follows up for headaches and difficulty focusing.  UPDATE: The patient was last seen in October 2017.  At that time she was referred to ophthalmology but ***.  She returns today for increased headaches.  ***  HISTORY:  She was diagnosed with idiopathic intracranial hypertension in 2013.  At that time, she experienced severe constant headache.  CT of the head from 09/19/2011 was unremarkable.  She followed up with a neurologist who performed a lumbar puncture and was found to have an opening pressure of 36.  She was subsequently started on Aceta Sulamyd.  A couple of weeks later, she developed worsening headaches and stiff neck.  She presented to [facility] where she was found to have a temperature of 100.7 and CBC demonstrated a WBC of 11.  She underwent a lumbar puncture and was diagnosed with aseptic meningitis.  She remained on Aceta Sulamyd for a little while and got better, so she discontinued it.  In 2017, she reported subtle problems with her left eye.  She denied blurred vision or double vision.  She stated that it did not seem like her left eye was moving with her right eye.  She reported her vision seems a little hazy in the left eye.  She denied headache or fever at that time.  PAST MEDICAL HISTORY: Past Medical History:  Diagnosis Date  . Anxiety state, unspecified   . Asthma   . Diarrhea   . Leg pain   . 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 Palpitations 11/2013   moderate per pt.  . Premature beats, unspecified    Isolated PVC on 12/28/13 EKG, probable cause of fluttering  . Pseudotumor cerebri   . Pure hypercholesterolemia   . PVC (premature ventricular contraction)     MEDICATIONS: Current Outpatient Medications on File Prior to Visit    Medication Sig Dispense Refill  . albuterol (PROVENTIL HFA;VENTOLIN HFA) 108 (90 Base) MCG/ACT inhaler Inhale 2 puffs into the lungs every 6 (six) hours as needed for wheezing or shortness of breath. 1 Inhaler 2  . clonazePAM (KLONOPIN) 0.5 MG tablet Take 0.5 mg by mouth 2 (two) times daily as needed for anxiety.    . MedroxyPROGESTERone Acetate 150 MG/ML SUSY Inject 150 mg into the muscle every 3 (three) months.    . Vitamin D, Ergocalciferol, (DRISDOL) 50000 units CAPS capsule Take 50,000 Units by mouth 2 (two) times a week.      No current facility-administered medications on file prior to visit.     ALLERGIES: Allergies  Allergen Reactions  . Penicillins     Has patient had a PCN reaction causing immediate rash, facial/tongue/throat swelling, SOB or lightheadedness with hypotension: NO (childhood RXN) Has patient had a PCN reaction causing severe rash involving mucus membranes or skin necrosis: NO Has patient had a PCN reaction that required hospitalization NO Has patient had a PCN reaction occurring within the last 10 years: NO If all of the above answers are "NO", then may proceed with Cephalosporin use.     FAMILY HISTORY: Family History  Problem Relation Age of Onset  . Cancer Other   . Diabetes Other   . Hypertension Other    SOCIAL HISTORY: Social History   Socioeconomic History  . Marital status: Married    Spouse name: Not  on file  . Number of children: Not on file  . Years of education: Not on file  . Highest education level: Not on file  Occupational History  . Not on file  Social Needs  . Financial resource strain: Not on file  . Food insecurity:    Worry: Not on file    Inability: Not on file  . Transportation needs:    Medical: Not on file    Non-medical: Not on file  Tobacco Use  . Smoking status: Current Every Day Smoker    Packs/day: 0.50    Types: Cigarettes  . Smokeless tobacco: Never Used  Substance and Sexual Activity  . Alcohol use: Yes     Comment: occ  . Drug use: No  . Sexual activity: Yes    Birth control/protection: Injection  Lifestyle  . Physical activity:    Days per week: Not on file    Minutes per session: Not on file  . Stress: Not on file  Relationships  . Social connections:    Talks on phone: Not on file    Gets together: Not on file    Attends religious service: Not on file    Active member of club or organization: Not on file    Attends meetings of clubs or organizations: Not on file    Relationship status: Not on file  . Intimate partner violence:    Fear of current or ex partner: Not on file    Emotionally abused: Not on file    Physically abused: Not on file    Forced sexual activity: Not on file  Other Topics Concern  . Not on file  Social History Narrative  . Not on file    REVIEW OF SYSTEMS: Constitutional: No fevers, chills, or sweats, no generalized fatigue, change in appetite Eyes: No visual changes, double vision, eye pain Ear, nose and throat: No hearing loss, ear pain, nasal congestion, sore throat Cardiovascular: No chest pain, palpitations Respiratory:  No shortness of breath at rest or with exertion, wheezes GastrointestinaI: No nausea, vomiting, diarrhea, abdominal pain, fecal incontinence Genitourinary:  No dysuria, urinary retention or frequency Musculoskeletal:  No neck pain, back pain Integumentary: No rash, pruritus, skin lesions Neurological: as above Psychiatric: No depression, insomnia, anxiety Endocrine: No palpitations, fatigue, diaphoresis, mood swings, change in appetite, change in weight, increased thirst Hematologic/Lymphatic:  No purpura, petechiae. Allergic/Immunologic: no itchy/runny eyes, nasal congestion, recent allergic reactions, rashes  PHYSICAL EXAM: *** General: No acute distress.  Patient appears ***-groomed.  *** body habitus. Head:  Normocephalic/atraumatic Eyes:  Fundi examined but not visualized Neck: supple, no paraspinal tenderness, full  range of motion Heart:  Regular rate and rhythm Lungs:  Clear to auscultation bilaterally Back: No paraspinal tenderness Neurological Exam: alert and oriented to person, place, and time. Attention span and concentration intact, recent and remote memory intact, fund of knowledge intact.  Speech fluent and not dysarthric, language intact.  CN II-XII intact. Bulk and tone normal, muscle strength 5/5 throughout.  Sensation to light touch, temperature and vibration intact.  Deep tendon reflexes 2+ throughout, toes downgoing.  Finger to nose and heel to shin testing intact.  Gait normal, Romberg negative.  IMPRESSION: 1.  Idiopathic intracranial hypertension 2.  Headache ***  PLAN: ***  Metta Clines, DO

## 2018-06-12 ENCOUNTER — Ambulatory Visit: Payer: BC Managed Care – PPO | Admitting: Neurology

## 2018-06-12 ENCOUNTER — Encounter

## 2018-10-18 ENCOUNTER — Ambulatory Visit: Payer: BC Managed Care – PPO | Admitting: Neurology

## 2018-10-19 NOTE — Progress Notes (Deleted)
Virtual Visit via Video Note The purpose of this virtual visit is to provide medical care while limiting exposure to the novel coronavirus.    Consent was obtained for video visit:  {yes no:314532} Answered questions that patient had about telehealth interaction:  {yes no:314532} I discussed the limitations, risks, security and privacy concerns of performing an evaluation and management service by telemedicine. I also discussed with the patient that there may be a patient responsible charge related to this service. The patient expressed understanding and agreed to proceed.  Pt location: Home Physician Location: office Name of referring provider:  No ref. provider found I connected with Sonya Page at patients initiation/request on 10/20/2018 at 11:00 AM EDT by video enabled telemedicine application and verified that I am speaking with the correct person using two identifiers. Pt MRN:  [id number] Pt DOB:  Sep 23, 1980 Video Participants:  Sonya Page;  ***   History of Present Illness:  Sonya Page is a 38 year old woman whom I previously saw once in 2017 for visual disturbance due to probable idiopathic intracranial hypertension follows up today for headaches.  UPDATE: She was first seen in consult on 04/09/16 for visual disturbance.  At that time, she reported several weeks of visual disturbance, described as feeling that her eyes were not moving ***.  She noted that her vision seemed a little hazy in the left eye but denied blurred vision, double vision, or headache.  She was referred to ophthalmology, Dr. Marshall Page, but she was then lost to follow up.  ***  ***  HISTORY: She was diagnosed with idiopathic intracranial hypertension in 2013.  At that time, she experienced severe constant headache.  CT of head performed on 09/19/11 for headache evaluation was personally reviewed and was unremarkable.  She followed up with a neurologist, who performed a lumbar puncture and  was found to have an opening pressure of 36.  She was subsequently started on acetazolamide.  A couple of weeks later, she developed worsening headache and stiff neck.  She presented to [facility] where she was found to have a temperature of 100.7 and CBC demonstrated a WBC of 11.  She underwent an LP, which demonstrated CSF cell count of I50 with Polys 3 and Mono 97.  Glucose was 58 and protein was 66.  Gram stain, culture, CMV, EBV, HSV and VZV were negative.  She was diagnosed with aseptic meningitis.  She remained on acetazolamide for a little while and got better, so she discontinued it.   Past Medical History: Past Medical History:  Diagnosis Date  . Anxiety state, unspecified   . Asthma   . Diarrhea   . Leg pain   . 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 Palpitations 11/2013   moderate per pt.  . Premature beats, unspecified    Isolated PVC on 12/28/13 EKG, probable cause of fluttering  . Pseudotumor cerebri   . Pure hypercholesterolemia   . PVC (premature ventricular contraction)     Medications: Outpatient Encounter Medications as of 10/20/2018  Medication Sig Note  . albuterol (PROVENTIL HFA;VENTOLIN HFA) 108 (90 Base) MCG/ACT inhaler Inhale 2 puffs into the lungs every 6 (six) hours as needed for wheezing or shortness of breath.   . clonazePAM (KLONOPIN) 0.5 MG tablet Take 0.5 mg by mouth 2 (two) times daily as needed for anxiety.   . MedroxyPROGESTERone Acetate 150 MG/ML SUSY Inject 150 mg into the muscle every 3 (three) months.   . Vitamin D,  Ergocalciferol, (DRISDOL) 50000 units CAPS capsule Take 50,000 Units by mouth 2 (two) times a week.  08/17/2015: Tuesdays and Thursdays a   No facility-administered encounter medications on file as of 10/20/2018.     Allergies: Allergies  Allergen Reactions  . Penicillins     Has patient had a PCN reaction causing immediate rash, facial/tongue/throat swelling, SOB or lightheadedness with hypotension: NO (childhood RXN)  Has patient had a PCN reaction causing severe rash involving mucus membranes or skin necrosis: NO Has patient had a PCN reaction that required hospitalization NO Has patient had a PCN reaction occurring within the last 10 years: NO If all of the above answers are "NO", then may proceed with Cephalosporin use.     Family History: Family History  Problem Relation Age of Onset  . Cancer Other   . Diabetes Other   . Hypertension Other     Social History: Social History   Socioeconomic History  . Marital status: Married    Spouse name: Not on file  . Number of children: Not on file  . Years of education: Not on file  . Highest education level: Not on file  Occupational History  . Not on file  Social Needs  . Financial resource strain: Not on file  . Food insecurity:    Worry: Not on file    Inability: Not on file  . Transportation needs:    Medical: Not on file    Non-medical: Not on file  Tobacco Use  . Smoking status: Current Every Day Smoker    Packs/day: 0.50    Types: Cigarettes  . Smokeless tobacco: Never Used  Substance and Sexual Activity  . Alcohol use: Yes    Comment: occ  . Drug use: No  . Sexual activity: Yes    Birth control/protection: Injection  Lifestyle  . Physical activity:    Days per week: Not on file    Minutes per session: Not on file  . Stress: Not on file  Relationships  . Social connections:    Talks on phone: Not on file    Gets together: Not on file    Attends religious service: Not on file    Active member of club or organization: Not on file    Attends meetings of clubs or organizations: Not on file    Relationship status: Not on file  . Intimate partner violence:    Fear of current or ex partner: Not on file    Emotionally abused: Not on file    Physically abused: Not on file    Forced sexual activity: Not on file  Other Topics Concern  . Not on file  Social History Narrative  . Not on file    Review Of Systems: ***     Observations/Objective:   There were no vitals filed for this visit.    Assessment and Plan:     Follow Up Instructions:    -I discussed the assessment and treatment plan with the patient. The patient was provided an opportunity to ask questions and all were answered. The patient agreed with the plan and demonstrated an understanding of the instructions.   The patient was advised to call back or seek an in-person evaluation if the symptoms worsen or if the condition fails to improve as anticipated.    Total Time spent in visit with the patient was:  ***, of which more than 50% of the time was spent in counseling and/or coordinating care on ***.  Pt understands and agrees with the plan of care outlined.     Dudley Major, DO

## 2018-10-20 ENCOUNTER — Telehealth: Payer: BC Managed Care – PPO | Admitting: Neurology

## 2018-10-26 NOTE — Progress Notes (Deleted)
Virtual Visit via Video Note The purpose of this virtual visit is to provide medical care while limiting exposure to the novel coronavirus.    Consent was obtained for video visit:  Yes Answered questions that patient had about telehealth interaction:  Yes I discussed the limitations, risks, security and privacy concerns of performing an evaluation and management service by telemedicine. I also discussed with the patient that there may be a patient responsible charge related to this service. The patient expressed understanding and agreed to proceed.  Pt location: Home Physician Location: Home Name of referring provider:  No ref. provider found I connected with Ezma L Thoman at patients initiation/request on 10/27/2018 at 10:30 AM EDT by video enabled telemedicine application and verified that I am speaking with the correct person using two identifiers. Pt MRN:  [id number] Pt DOB:  July 04, 1980 Video Participants:  Nazariah L Spelman   History of Present Illness:  Sonya Page is a 38 year old woman whom I saw in October 2017 for pseudotumor cerebri presents today for worsening headaches.  She was diagnosed with idiopathic intracranial hypertension in 2013.  At that time, she experienced severe constant headache.  CT of head performed on 09/19/11 for headache evaluation was personally reviewed and was unremarkable.  She followed up with a neurologist, who performed a lumbar puncture and was found to have an opening pressure of 36.  She was subsequently started on acetazolamide.  A couple of weeks later, she developed worsening headache and stiff neck.  She presented to [facility] where she was found to have a temperature of 100.7 and CBC demonstrated a WBC of 11.  She underwent an LP, which demonstrated CSF cell count of I50 with Polys 3 and Mono 97.  Glucose was 58 and protein was 66.  Gram stain, culture, CMV, EBV, HSV and VZV were negative.  She was diagnosed with aseptic meningitis.  She remained  on acetazolamide for a little while and got better, so she discontinued it.    In 2017, she reported a subtle problem with her left eye.  She denied blurred vision or double vision.  She said that it didn't seem like her left eye is moving with her right eye.  She endorsed that her vision seems a little hazy in the left eye.  She denied headache or fever.  At that time, I referred her to ophthalmology ***.  ***  Past Medical History: Past Medical History:  Diagnosis Date  . Anxiety state, unspecified   . Asthma   . Diarrhea   . Leg pain   . 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 Palpitations 11/2013   moderate per pt.  . Premature beats, unspecified    Isolated PVC on 12/28/13 EKG, probable cause of fluttering  . Pseudotumor cerebri   . Pure hypercholesterolemia   . PVC (premature ventricular contraction)     Medications: Outpatient Encounter Medications as of 10/27/2018  Medication Sig Note  . albuterol (PROVENTIL HFA;VENTOLIN HFA) 108 (90 Base) MCG/ACT inhaler Inhale 2 puffs into the lungs every 6 (six) hours as needed for wheezing or shortness of breath.   . clonazePAM (KLONOPIN) 0.5 MG tablet Take 0.5 mg by mouth 2 (two) times daily as needed for anxiety.   . MedroxyPROGESTERone Acetate 150 MG/ML SUSY Inject 150 mg into the muscle every 3 (three) months.   . Vitamin D, Ergocalciferol, (DRISDOL) 50000 units CAPS capsule Take 50,000 Units by mouth 2 (two) times a week.  08/17/2015: Tuesdays  and Thursdays a   No facility-administered encounter medications on file as of 10/27/2018.     Allergies: Allergies  Allergen Reactions  . Penicillins     Has patient had a PCN reaction causing immediate rash, facial/tongue/throat swelling, SOB or lightheadedness with hypotension: NO (childhood RXN) Has patient had a PCN reaction causing severe rash involving mucus membranes or skin necrosis: NO Has patient had a PCN reaction that required hospitalization NO Has patient had  a PCN reaction occurring within the last 10 years: NO If all of the above answers are "NO", then may proceed with Cephalosporin use.     Family History: Family History  Problem Relation Age of Onset  . Cancer Other   . Diabetes Other   . Hypertension Other     Social History: Social History   Socioeconomic History  . Marital status: Married    Spouse name: Not on file  . Number of children: Not on file  . Years of education: Not on file  . Highest education level: Not on file  Occupational History  . Not on file  Social Needs  . Financial resource strain: Not on file  . Food insecurity:    Worry: Not on file    Inability: Not on file  . Transportation needs:    Medical: Not on file    Non-medical: Not on file  Tobacco Use  . Smoking status: Current Every Day Smoker    Packs/day: 0.50    Types: Cigarettes  . Smokeless tobacco: Never Used  Substance and Sexual Activity  . Alcohol use: Yes    Comment: occ  . Drug use: No  . Sexual activity: Yes    Birth control/protection: Injection  Lifestyle  . Physical activity:    Days per week: Not on file    Minutes per session: Not on file  . Stress: Not on file  Relationships  . Social connections:    Talks on phone: Not on file    Gets together: Not on file    Attends religious service: Not on file    Active member of club or organization: Not on file    Attends meetings of clubs or organizations: Not on file    Relationship status: Not on file  . Intimate partner violence:    Fear of current or ex partner: Not on file    Emotionally abused: Not on file    Physically abused: Not on file    Forced sexual activity: Not on file  Other Topics Concern  . Not on file  Social History Narrative  . Not on file    Review Of Systems: ***    Observations/Objective:   There were no vitals filed for this visit.    Assessment and Plan:     Follow Up Instructions:    -I discussed the assessment and treatment plan  with the patient. The patient was provided an opportunity to ask questions and all were answered. The patient agreed with the plan and demonstrated an understanding of the instructions.   The patient was advised to call back or seek an in-person evaluation if the symptoms worsen or if the condition fails to improve as anticipated.    Total Time spent in visit with the patient was:  ***, of which more than 50% of the time was spent in counseling and/or coordinating care on ***.   Pt understands and agrees with the plan of care outlined.     Dudley Major, DO

## 2018-10-27 ENCOUNTER — Telehealth: Payer: BC Managed Care – PPO | Admitting: Neurology

## 2019-06-29 ENCOUNTER — Other Ambulatory Visit: Payer: Self-pay | Admitting: Obstetrics and Gynecology

## 2019-06-29 DIAGNOSIS — N644 Mastodynia: Secondary | ICD-10-CM

## 2020-03-27 ENCOUNTER — Encounter: Payer: Self-pay | Admitting: Cardiology

## 2020-03-27 ENCOUNTER — Ambulatory Visit: Payer: BC Managed Care – PPO

## 2020-03-27 ENCOUNTER — Other Ambulatory Visit: Payer: Self-pay

## 2020-03-27 ENCOUNTER — Ambulatory Visit: Payer: BC Managed Care – PPO | Admitting: Cardiology

## 2020-03-27 VITALS — BP 125/67 | HR 103 | Resp 16 | Ht 63.0 in | Wt 239.0 lb

## 2020-03-27 DIAGNOSIS — R002 Palpitations: Secondary | ICD-10-CM

## 2020-03-27 DIAGNOSIS — R000 Tachycardia, unspecified: Secondary | ICD-10-CM

## 2020-03-27 MED ORDER — METOPROLOL TARTRATE 25 MG PO TABS: 12.5000 mg | ORAL_TABLET | Freq: Two times a day (BID) | ORAL | 3 refills | Status: DC

## 2020-03-27 NOTE — Progress Notes (Signed)
Patient referred by Berkley Harvey, NP for tachycardia, palpitations  Subjective:   Sonya Page, female    DOB: [birth year], 39 y.o.   MRN: [id number]   Chief Complaint  Patient presents with  . Tachycardia  . Abnormal ECG  . New Patient (Initial Visit)     HPI  39 y.o. 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 She previously tried metoprolol, but it dropped her blood pressure. She reportedly has had echocardiogram, which was normal. TSH was also reportedly normal. Reports not available to me. Recently, she had episodes of palpitations, which was unusual for her, in spite of having high resting heart rate.  She reports stressors at home. She has been trying to cope with anxiety over the years, and feels she has done well.   Past Medical History:  Diagnosis Date  . Anxiety state, unspecified   . Asthma   . Diarrhea   . Leg pain   . 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 Palpitations 11/2013   moderate per pt.  . Premature beats, unspecified    Isolated PVC on 12/28/13 EKG, probable cause of fluttering  . Pseudotumor cerebri   . Pure hypercholesterolemia   . PVC (premature ventricular contraction)      Past Surgical History:  Procedure Laterality Date  . CESAREAN SECTION  2010     Social History   Tobacco Use  Smoking Status Current Every Day Smoker  . Packs/day: 0.50  . Types: Cigarettes  Smokeless Tobacco Never Used    Social History   Substance and Sexual Activity  Alcohol Use Yes   Comment: occ     Family History  Problem Relation Age of Onset  . Cancer Other   . Diabetes Other   . Hypertension Other      Current Outpatient Medications on File Prior to Visit  Medication Sig Dispense Refill  . albuterol (VENTOLIN HFA) 108 (90 Base) MCG/ACT inhaler Inhale into the lungs as needed.    . clonazePAM (KLONOPIN) 0.5 MG tablet Take 0.5 mg by mouth 2 (two) times  daily as needed for anxiety.    . MedroxyPROGESTERone Acetate 150 MG/ML SUSY Inject 150 mg into the muscle every 3 (three) months.    . Vitamin D, Ergocalciferol, (DRISDOL) 50000 units CAPS capsule Take 50,000 Units by mouth 2 (two) times a week.      No current facility-administered medications on file prior to visit.    Cardiovascular and other pertinent studies:  EKG 03/27/2020: Sinus tachycardia 105 bpm Borderline left atrial enlargement Low voltage precordial leads    Recent labs: 01/04/2020: Glucose 87, BUN/Cr 15/0.98. EGFR 85. Na/K 137/4.3. Rest of the CMP normal Chol 196, TG 60, HDL 47, LDL N/A TSH N/A  09/2019: Hb 14. MCV 83.    Review of Systems  Cardiovascular: Positive for palpitations. Negative for chest pain, dyspnea on exertion, leg swelling and syncope.  Psychiatric/Behavioral: The patient is nervous/anxious.          Vitals:   03/27/20 1616  BP: 125/67  Pulse: (!) 103  Resp: 16  SpO2: 98%     Body mass index is 42.34 kg/m. Filed Weights   03/27/20 1616  Weight: 239 lb (108.4 kg)     Objective:   Physical Exam Vitals and nursing note reviewed.  Constitutional:      General: She is not in acute distress. Neck:     Vascular: No JVD.  Cardiovascular:     Rate and Rhythm: Regular rhythm. Tachycardia present.     Heart sounds: Normal heart sounds. No murmur heard.   Pulmonary:     Effort: Pulmonary effort is normal.     Breath sounds: Normal breath sounds. No wheezing or rales.          Assessment & Recommendations:   39 y.o. African American female with tachycardia/palpitations  Tachycardia/palpitations: Sinus tachycardia at rest. Suspect related to anxiety, or inappropriate sinus tachycardia. Reportedly normal echocardiogram in the past. Recommend cardiac telemetry to rule out any other arrhthymias.  Given her h/o asthma, would avoid propranolol. Will try low dose metoprolol 12.5 mg bid.. This can be up-titrated as tolerated.     Thank you for referring the patient to [place]. Please feel free to contact with any questions.   Nigel Mormon, MD Pager: [phone] Office: [phone]

## 2020-04-07 ENCOUNTER — Telehealth: Payer: Self-pay

## 2020-04-07 NOTE — Telephone Encounter (Signed)
If unable to tolerate metoprolol, okay to stop, Could try diltiazem 120 mg once/day, instead. If okay with the patient, please send 30 pillsX3 refills.  Thanks MJP

## 2020-04-07 NOTE — Telephone Encounter (Signed)
Spoke with the patient.  She is not taking metoprolol regularly.  Therefore, I cannot attribute her symptoms to metoprolol.  She is agreed to take metoprolol regularly for next 2 weeks and see her if her symptoms improve.  She will call back, if symptoms do not improve.  Thanks MJP

## 2020-04-07 NOTE — Telephone Encounter (Signed)
Received message from patient regarding Metoprolol. Per patient, metoprolol has been taken as directed for a few days for palps/fluttering. However, patient states that she believes the Metoprolol is making her palpitations worse. Patient would like to know if she should continue to take the Metoprolol or change medications. No other symptoms noted. Please advise. Thanks!

## 2020-04-07 NOTE — Telephone Encounter (Signed)
Relayed information to patient. Patient feels uneasy starting a new medication Patient is concerned that diltiazem/metoprolol are going to cause her BP is drop too much. Patient is requesting a call with further details as to why one medication would be more beneficial. Patient wants to clarify that her blood pressures have been normal, it's her heart rate that stays <100bpm. Please advise. Thanks!

## 2020-04-24 NOTE — Progress Notes (Signed)
Spoke to patient she is aware

## 2020-04-24 NOTE — Progress Notes (Signed)
Called patient, NA, No Vmbox to leave a message.

## 2020-04-24 NOTE — Progress Notes (Signed)
Pt didn't answer left a vm will try again later

## 2020-06-27 NOTE — Progress Notes (Signed)
Error

## 2020-06-30 ENCOUNTER — Ambulatory Visit: Payer: BC Managed Care – PPO | Admitting: Cardiology

## 2020-06-30 ENCOUNTER — Encounter: Payer: Self-pay | Admitting: Cardiology

## 2020-06-30 ENCOUNTER — Other Ambulatory Visit: Payer: Self-pay

## 2020-06-30 VITALS — BP 117/77 | HR 92 | Ht 63.0 in | Wt 237.0 lb

## 2020-06-30 DIAGNOSIS — R002 Palpitations: Secondary | ICD-10-CM

## 2020-06-30 DIAGNOSIS — R000 Tachycardia, unspecified: Secondary | ICD-10-CM

## 2020-06-30 NOTE — Progress Notes (Signed)
   Patient referred by Berkley Harvey, NP for tachycardia, palpitations  Subjective:   Sonya Page, female    DOB: 1981-03-13, 41 y.o.   MRN: [id number]   Chief Complaint  Patient presents with  . Palpitations  . Follow-up     Palpitations  Associated symptoms include anxiety. Pertinent negatives include no chest pain or syncope.    40 y.o. African American female with tachycardia/palpitations  Patient was not able to tolerate metoprolol 12.5 mg bid due to hypotension. However, her symptoms of palpitations have improved. Echocardiogram is pending. Monitor did not show any significant arrhthymias.     Current Outpatient Medications on File Prior to Visit  Medication Sig Dispense Refill  . albuterol (VENTOLIN HFA) 108 (90 Base) MCG/ACT inhaler Inhale into the lungs as needed.    . clonazePAM (KLONOPIN) 0.5 MG tablet Take 0.5 mg by mouth 2 (two) times daily as needed for anxiety.    . MedroxyPROGESTERone Acetate 150 MG/ML SUSY Inject 150 mg into the muscle every 3 (three) months.     No current facility-administered medications on file prior to visit.    Cardiovascular and other pertinent studies:  Mobile cardiac telemetry 13 days 03/28/2020 - 04/11/2020: Dominant rhythm: Sinus. HR 65-172 bpm. Avg HR 91 bpm. Rare isolated SVE, no couplet/triplets. Rare isolated VE/triplet, 0 couplet No atrial fibrillation/atrial flutter/SVT/VT/high grade AV block, sinus pause >3sec noted. 0 patient triggered events.     EKG 03/27/2020: Sinus tachycardia 105 bpm Borderline left atrial enlargement Low voltage precordial leads  Recent labs: 01/04/2020: Glucose 87, BUN/Cr 15/0.98. EGFR 85. Na/K 137/4.3. Rest of the CMP normal Chol 196, TG 60, HDL 47, LDL N/A TSH N/A  09/2019: Hb 14. MCV 83.    Review of Systems  Cardiovascular: Positive for palpitations. Negative for chest pain, dyspnea on exertion, leg swelling and syncope.  Psychiatric/Behavioral: The patient is  nervous/anxious.          Vitals:   06/30/20 1156  BP: 117/77  Pulse: 92  SpO2: 100%     Body mass index is 41.98 kg/m. Filed Weights   06/30/20 1156  Weight: 237 lb (107.5 kg)     Objective:   Physical Exam Vitals and nursing note reviewed.  Constitutional:      General: She is not in acute distress. Neck:     Vascular: No JVD.  Cardiovascular:     Rate and Rhythm: Regular rhythm. Tachycardia present.     Heart sounds: Normal heart sounds. No murmur heard.   Pulmonary:     Effort: Pulmonary effort is normal.     Breath sounds: Normal breath sounds. No wheezing or rales.          Assessment & Recommendations:   40 y.o. African American female with tachycardia/palpitations  Tachycardia/palpitations: No arrhthymias on monitor. Echocardiogram pending.   F/u as needed   Nigel Mormon, MD Pager: [phone] Office: [phone]

## 2020-07-24 ENCOUNTER — Other Ambulatory Visit: Payer: BC Managed Care – PPO

## 2020-08-11 ENCOUNTER — Ambulatory Visit: Payer: BC Managed Care – PPO

## 2020-08-11 ENCOUNTER — Other Ambulatory Visit: Payer: Self-pay

## 2020-08-11 DIAGNOSIS — R000 Tachycardia, unspecified: Secondary | ICD-10-CM

## 2020-08-11 NOTE — Progress Notes (Signed)
Called and spoke with pt regarding echo results. Pt voiced understanding.

## 2022-09-22 ENCOUNTER — Emergency Department (HOSPITAL_BASED_OUTPATIENT_CLINIC_OR_DEPARTMENT_OTHER): Payer: BC Managed Care – PPO

## 2022-09-22 ENCOUNTER — Emergency Department (HOSPITAL_BASED_OUTPATIENT_CLINIC_OR_DEPARTMENT_OTHER)
Admission: EM | Admit: 2022-09-22 | Discharge: 2022-09-22 | Disposition: A | Discharge status: Home / Self Care | Payer: BC Managed Care – PPO | Attending: Emergency Medicine | Admitting: Emergency Medicine

## 2022-09-22 ENCOUNTER — Encounter (HOSPITAL_BASED_OUTPATIENT_CLINIC_OR_DEPARTMENT_OTHER): Payer: Self-pay | Admitting: *Deleted

## 2022-09-22 DIAGNOSIS — S161XXA Strain of muscle, fascia and tendon at neck level, initial encounter: Secondary | ICD-10-CM | POA: Diagnosis not present

## 2022-09-22 DIAGNOSIS — D72829 Elevated white blood cell count, unspecified: Secondary | ICD-10-CM | POA: Diagnosis not present

## 2022-09-22 DIAGNOSIS — R1030 Lower abdominal pain, unspecified: Secondary | ICD-10-CM | POA: Insufficient documentation

## 2022-09-22 DIAGNOSIS — S199XXA Unspecified injury of neck, initial encounter: Secondary | ICD-10-CM | POA: Diagnosis present

## 2022-09-22 DIAGNOSIS — R0781 Pleurodynia: Secondary | ICD-10-CM | POA: Diagnosis not present

## 2022-09-22 DIAGNOSIS — R930 Abnormal findings on diagnostic imaging of skull and head, not elsewhere classified: Secondary | ICD-10-CM | POA: Insufficient documentation

## 2022-09-22 DIAGNOSIS — Y92410 Unspecified street and highway as the place of occurrence of the external cause: Secondary | ICD-10-CM | POA: Insufficient documentation

## 2022-09-22 LAB — HEPATIC FUNCTION PANEL
ALT: 24 U/L (ref 0–44)
AST: 26 U/L (ref 15–41)
Albumin: 3.8 g/dL (ref 3.5–5.0)
Alkaline Phosphatase: 53 U/L (ref 38–126)
Bilirubin, Direct: <0.1 mg/dL (ref 0.0–0.2)
Total Bilirubin: 0.4 mg/dL (ref 0.3–1.2)
Total Protein: 7.2 g/dL (ref 6.5–8.1)

## 2022-09-22 LAB — CBC WITH DIFFERENTIAL/PLATELET
Abs Immature Granulocytes: 0.04 10*3/uL (ref 0.00–0.07)
Basophils Absolute: 0 10*3/uL (ref 0.0–0.1)
Basophils Relative: 0 %
Eosinophils Absolute: 0.1 10*3/uL (ref 0.0–0.5)
Eosinophils Relative: 1 %
HCT: 43.5 % (ref 36.0–46.0)
Hemoglobin: 14.1 g/dL (ref 12.0–15.0)
Immature Granulocytes: 0 %
Lymphocytes Relative: 13 %
Lymphs Abs: 1.5 10*3/uL (ref 0.7–4.0)
MCH: 27.3 pg (ref 26.0–34.0)
MCHC: 32.4 g/dL (ref 30.0–36.0)
MCV: 84.1 fL (ref 80.0–100.0)
Monocytes Absolute: 0.7 10*3/uL (ref 0.1–1.0)
Monocytes Relative: 6 %
Neutro Abs: 9.3 10*3/uL — ABNORMAL HIGH (ref 1.7–7.7)
Neutrophils Relative %: 80 %
Platelets: 242 10*3/uL (ref 150–400)
RBC: 5.17 MIL/uL — ABNORMAL HIGH (ref 3.87–5.11)
RDW: 12.6 % (ref 11.5–15.5)
WBC: 11.6 10*3/uL — ABNORMAL HIGH (ref 4.0–10.5)
nRBC: 0 % (ref 0.0–0.2)

## 2022-09-22 LAB — BASIC METABOLIC PANEL
Anion gap: 8 (ref 5–15)
BUN: 12 mg/dL (ref 6–20)
CO2: 21 mmol/L — ABNORMAL LOW (ref 22–32)
Calcium: 8.8 mg/dL — ABNORMAL LOW (ref 8.9–10.3)
Chloride: 108 mmol/L (ref 98–111)
Creatinine, Ser: 0.85 mg/dL (ref 0.44–1.00)
GFR, Estimated: >60 mL/min (ref >60)
Glucose, Bld: 86 mg/dL (ref 70–99)
Potassium: 3.7 mmol/L (ref 3.5–5.1)
Sodium: 137 mmol/L (ref 135–145)

## 2022-09-22 LAB — LIPASE, BLOOD: Lipase: 29 U/L (ref 11–51)

## 2022-09-22 LAB — HCG, QUANTITATIVE, PREGNANCY: hCG, Beta Chain, Quant, S: <1 m[IU]/mL (ref <5)

## 2022-09-22 MED ORDER — OXYCODONE-ACETAMINOPHEN 5-325 MG PO TABS
1.0000 | ORAL_TABLET | Freq: Once | ORAL | Status: AC
  Administered 2022-09-22: 1 via ORAL
  Filled 2022-09-22: qty 1

## 2022-09-22 MED ORDER — IBUPROFEN 600 MG PO TABS: 600.0000 mg | ORAL_TABLET | Freq: Four times a day (QID) | ORAL | 0 refills | Status: AC | PRN

## 2022-09-22 MED ORDER — CYCLOBENZAPRINE HCL 10 MG PO TABS: 10.0000 mg | ORAL_TABLET | Freq: Two times a day (BID) | ORAL | 0 refills | Status: AC | PRN

## 2022-09-22 MED ORDER — IBUPROFEN 400 MG PO TABS
600.0000 mg | ORAL_TABLET | Freq: Once | ORAL | Status: AC
  Administered 2022-09-22: 600 mg via ORAL
  Filled 2022-09-22: qty 1

## 2022-09-22 MED ORDER — IOHEXOL 300 MG/ML  SOLN
100.0000 mL | Freq: Once | INTRAMUSCULAR | Status: AC | PRN
  Administered 2022-09-22: 100 mL via INTRAVENOUS
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
## 2022-09-22 NOTE — ED Notes (Signed)
Pt reports that she needs to void before IV placement

## 2022-09-22 NOTE — Discharge Instructions (Addendum)
It was a pleasure taking care of you today!  Your imaging in the ED was negative for fracture or dislocations. You will feel more sore in the morning. You are prescribed Flexeril (muscle relaxer). Do not drive or operate heavy machinery while taking the muscle relaxer. You will be sent a prescription for ibuprofen as well. You may take over the counter 600 mg Ibuprofen every 6 hours and alternate with 500 mg Tylenol every 6 hours as needed for pain for no more than 7 days. You may apply ice or heat to affected area for up to 15 minutes at a time. Ensure to place a barrier between your skin and the ice/heat. Return to the Emergency Department if you are experiencing increasing/worsening symptoms.

## 2023-10-14 ENCOUNTER — Other Ambulatory Visit: Payer: Self-pay

## 2023-10-14 ENCOUNTER — Encounter (HOSPITAL_BASED_OUTPATIENT_CLINIC_OR_DEPARTMENT_OTHER): Payer: Self-pay | Admitting: Urology

## 2023-10-14 ENCOUNTER — Emergency Department (HOSPITAL_BASED_OUTPATIENT_CLINIC_OR_DEPARTMENT_OTHER)

## 2023-10-14 ENCOUNTER — Emergency Department (HOSPITAL_BASED_OUTPATIENT_CLINIC_OR_DEPARTMENT_OTHER)
Admission: EM | Admit: 2023-10-14 | Discharge: 2023-10-14 | Disposition: A | Discharge status: Home / Self Care | Attending: Emergency Medicine | Admitting: Emergency Medicine

## 2023-10-14 DIAGNOSIS — M79662 Pain in left lower leg: Secondary | ICD-10-CM | POA: Diagnosis not present

## 2023-10-14 DIAGNOSIS — M79661 Pain in right lower leg: Secondary | ICD-10-CM | POA: Diagnosis present

## 2023-10-14 DIAGNOSIS — F17200 Nicotine dependence, unspecified, uncomplicated: Secondary | ICD-10-CM | POA: Diagnosis not present

## 2023-10-14 DIAGNOSIS — Z8616 Personal history of COVID-19: Secondary | ICD-10-CM | POA: Diagnosis not present

## 2023-10-14 NOTE — ED Triage Notes (Signed)
 States +COVID on Tuesday  Since started having severe right calf pain and states some pain in Left calf as well   No h/o clots  +birthcontrol (IUD) , + smoker

## 2023-10-14 NOTE — ED Provider Notes (Signed)
 [facility] [facility] Provider Note   CSN: [id number] Arrival date & time: 10/14/23  1620     History  Chief Complaint  Patient presents with   Calf Pain    Covid Positive    Sonya Page is a 43 y.o. female.  Patient is a 43 year old female presenting for bilateral calf pain, worse on the right, without any rashes, wounds, or injuries associated.  No localized redness or swelling.  Sent in by her PCP to rule out DVT.  Recent diagnosis of COVID approximately 4 days ago.  No prior history of DVTs.  No blood thinner use.  No known hematological disorders.  The history is provided by the patient. No language interpreter was used.       Home Medications Prior to Admission medications   Medication Sig Start Date End Date Taking? Authorizing Provider  albuterol  (VENTOLIN  HFA) 108 (90 Base) MCG/ACT inhaler Inhale into the lungs as needed. 03/11/16   [provider]  clonazePAM  (KLONOPIN ) 0.5 MG tablet Take 0.5 mg by mouth 2 (two) times daily as needed for anxiety.    [provider]  hydrOXYzine (ATARAX/VISTARIL) 25 MG tablet Take 25 mg by mouth 3 (three) times daily as needed.    [provider]  ibuprofen  (ADVIL ) 600 MG tablet Take 1 tablet (600 mg total) by mouth every 6 (six) hours as needed. 09/22/22   Blue, Soijett A, PA-C  MedroxyPROGESTERone Acetate 150 MG/ML SUSY Inject 150 mg into the muscle every 3 (three) months. 07/02/15   [provider]  TROKENDI XR 25 MG CP24 Take 25 mg by mouth at bedtime. 06/10/20   [provider]      Allergies    Penicillins    Review of Systems   Review of Systems  Constitutional:  Negative for chills and fever.  Cardiovascular:  Negative for leg swelling.  Skin:  Negative for color change and wound.  Neurological:  Negative for weakness and numbness.    Physical Exam Updated Vital Signs BP 117/81 (BP Location: Left Arm)   Pulse (!) 101   Temp (!) 96.8 F  (36 C) (Tympanic)   Resp (!) 22   Ht 5\' 3"  (1.6 m)   Wt 107.5 kg   SpO2 100%   BMI 41.98 kg/m  Physical Exam Vitals and nursing note reviewed.  Constitutional:      Appearance: Normal appearance.  HENT:     Head: Normocephalic.  Cardiovascular:     Rate and Rhythm: Normal rate.  Pulmonary:     Effort: Pulmonary effort is normal.  Musculoskeletal:     Right knee: Normal.     Right lower leg: Normal.     Left lower leg: Normal.     Right ankle: Normal.     Left ankle: Normal.     Right foot: Normal.     Left foot: Normal.  Skin:    General: Skin is warm.     Capillary Refill: Capillary refill takes less than 2 seconds.  Neurological:     Mental Status: She is alert.     Sensory: Sensation is intact.     Motor: Motor function is intact.     ED Results / Procedures / Treatments   Labs (all labs ordered are listed, but only abnormal results are displayed) Labs Reviewed - No data to display  EKG None  Radiology No results found.  Procedures Procedures    Medications Ordered in ED Medications - No  data to display  ED Course/ Medical Decision Making/ A&P                                 Medical Decision Making  43 year old female presenting for bilateral calf pain, worse on the right, without any rashes, wounds, or injuries associated.  No localized redness or swelling.  Sent in by her PCP to rule out DVT.  Recent diagnosis of COVID approximately 4 days ago.  No prior history of DVTs.  No blood thinner use.  No known hematological disorders.  Exam unimpressive on exam.  No cellulitis.  Lower extremity has soft compartments.  Neurovascularly intact.  Low suspicion arterial etiology.  Ultrasound was ordered in the emergency department but patient had to leave prior to ultrasound completion due to having to get home for other activities.  Her leg remains neurovascularly intact with soft compartments.  No acute life-threatening ailments at this time.  I ordered an  outpatient study to be completed tomorrow.  Patient in no distress and overall condition improved here in the ED. Detailed discussions were had with the patient regarding current findings, and need for close f/u with PCP or on call doctor. The patient has been instructed to return immediately if the symptoms worsen in any way for re-evaluation. Patient verbalized understanding and is in agreement with current care plan. All questions answered prior to discharge.         Final Clinical Impression(s) / ED Diagnoses Final diagnoses:  Bilateral calf pain    Rx / DC Orders ED Discharge Orders          Ordered    US  Venous Img Lower Bilateral        10/14/23 1840              Quinn Bucco, DO 10/14/23 1842

## 2023-10-14 NOTE — ED Notes (Signed)
 Patient transported to Ultrasound

## 2023-10-14 NOTE — ED Notes (Signed)
 Patient states she needs to leave to take care of her son. Discussed outpatient US  option. Patient agreeable to option. DO Martina Sledge made aware.

## 2023-10-14 NOTE — Discharge Instructions (Signed)
 Today you came for a ultrasound to rule out a DVT in your lower extremities after sent in by your primary care physician with recent diagnosis of COVID and cramping in the bilateral calfs.  You had to leave prior to completion of ultrasound.  I ordered an outpatient ultrasound for you.  They should call you sometime tomorrow and organize ultrasound.  Afterward you will wait for the results and an emergency physician will come speak with you if anything is positive.

## 2024-03-23 ENCOUNTER — Ambulatory Visit: Admitting: Cardiology
